# Patient Record
Sex: Male | Born: 1935 | Race: White | Hispanic: No | Smoking: Former smoker
Health system: Southern US, Community
[De-identification: ages and names within clinical notes are randomized; demographics above are authoritative.]

## PROBLEM LIST (undated history)

## (undated) DIAGNOSIS — I2510 Atherosclerotic heart disease of native coronary artery without angina pectoris: Secondary | ICD-10-CM

## (undated) DIAGNOSIS — R011 Cardiac murmur, unspecified: Secondary | ICD-10-CM

## (undated) DIAGNOSIS — IMO0001 Reserved for inherently not codable concepts without codable children: Secondary | ICD-10-CM

## (undated) DIAGNOSIS — K219 Gastro-esophageal reflux disease without esophagitis: Secondary | ICD-10-CM

## (undated) DIAGNOSIS — N183 Chronic kidney disease, stage 3 unspecified: Secondary | ICD-10-CM

## (undated) DIAGNOSIS — M21371 Foot drop, right foot: Secondary | ICD-10-CM

## (undated) DIAGNOSIS — G8929 Other chronic pain: Secondary | ICD-10-CM

## (undated) DIAGNOSIS — C801 Malignant (primary) neoplasm, unspecified: Secondary | ICD-10-CM

## (undated) DIAGNOSIS — M549 Dorsalgia, unspecified: Secondary | ICD-10-CM

## (undated) DIAGNOSIS — T4145XA Adverse effect of unspecified anesthetic, initial encounter: Secondary | ICD-10-CM

## (undated) DIAGNOSIS — M1990 Unspecified osteoarthritis, unspecified site: Secondary | ICD-10-CM

## (undated) DIAGNOSIS — N289 Disorder of kidney and ureter, unspecified: Secondary | ICD-10-CM

## (undated) DIAGNOSIS — I219 Acute myocardial infarction, unspecified: Secondary | ICD-10-CM

## (undated) DIAGNOSIS — T8859XA Other complications of anesthesia, initial encounter: Secondary | ICD-10-CM

## (undated) DIAGNOSIS — E7800 Pure hypercholesterolemia, unspecified: Secondary | ICD-10-CM

## (undated) DIAGNOSIS — G4700 Insomnia, unspecified: Secondary | ICD-10-CM

## (undated) DIAGNOSIS — I509 Heart failure, unspecified: Secondary | ICD-10-CM

## (undated) DIAGNOSIS — I209 Angina pectoris, unspecified: Secondary | ICD-10-CM

## (undated) DIAGNOSIS — I10 Essential (primary) hypertension: Secondary | ICD-10-CM

## (undated) HISTORY — PX: INGUINAL HERNIA REPAIR: SUR1180

## (undated) HISTORY — PX: EXCISIONAL HEMORRHOIDECTOMY: SHX1541

## (undated) HISTORY — PX: CHOLECYSTECTOMY: SHX55

## (undated) HISTORY — PX: ABDOMINAL AORTIC ANEURYSM REPAIR: SUR1152

## (undated) HISTORY — PX: JOINT REPLACEMENT: SHX530

## (undated) HISTORY — PX: ESOPHAGOGASTRODUODENOSCOPY: SHX1529

## (undated) HISTORY — PX: TONSILLECTOMY: SUR1361

## (undated) HISTORY — PX: TOTAL KNEE ARTHROPLASTY: SHX125

## (undated) HISTORY — PX: SKIN CANCER EXCISION: SHX779

---

## 1981-10-22 HISTORY — PX: CORONARY ARTERY BYPASS GRAFT: SHX141

## 1982-09-20 ENCOUNTER — Encounter: Payer: Self-pay | Admitting: Cardiology

## 1982-09-22 ENCOUNTER — Encounter: Payer: Self-pay | Admitting: Cardiology

## 2003-01-24 ENCOUNTER — Emergency Department (HOSPITAL_COMMUNITY): Admission: EM | Admit: 2003-01-24 | Discharge: 2003-01-24 | Payer: Self-pay | Admitting: Emergency Medicine

## 2004-02-29 ENCOUNTER — Encounter: Payer: Self-pay | Admitting: Cardiology

## 2009-03-16 ENCOUNTER — Encounter: Payer: Self-pay | Admitting: Cardiology

## 2009-07-01 ENCOUNTER — Encounter: Payer: Self-pay | Admitting: Cardiology

## 2009-12-12 ENCOUNTER — Ambulatory Visit: Payer: Self-pay | Admitting: Cardiology

## 2009-12-12 ENCOUNTER — Inpatient Hospital Stay (HOSPITAL_COMMUNITY): Admission: EM | Admit: 2009-12-12 | Discharge: 2009-12-20 | Payer: Self-pay | Admitting: Emergency Medicine

## 2009-12-13 ENCOUNTER — Encounter: Payer: Self-pay | Admitting: Cardiology

## 2009-12-16 ENCOUNTER — Encounter: Payer: Self-pay | Admitting: Cardiology

## 2009-12-22 ENCOUNTER — Ambulatory Visit: Payer: Self-pay | Admitting: Internal Medicine

## 2009-12-22 DIAGNOSIS — I5032 Chronic diastolic (congestive) heart failure: Secondary | ICD-10-CM

## 2009-12-22 DIAGNOSIS — I2510 Atherosclerotic heart disease of native coronary artery without angina pectoris: Secondary | ICD-10-CM | POA: Insufficient documentation

## 2009-12-22 DIAGNOSIS — I4891 Unspecified atrial fibrillation: Secondary | ICD-10-CM | POA: Insufficient documentation

## 2009-12-27 ENCOUNTER — Ambulatory Visit: Payer: Self-pay | Admitting: Cardiology

## 2009-12-28 ENCOUNTER — Telehealth (INDEPENDENT_AMBULATORY_CARE_PROVIDER_SITE_OTHER): Payer: Self-pay | Admitting: *Deleted

## 2010-01-05 ENCOUNTER — Ambulatory Visit: Payer: Self-pay | Admitting: Cardiology

## 2010-01-05 ENCOUNTER — Telehealth (INDEPENDENT_AMBULATORY_CARE_PROVIDER_SITE_OTHER): Payer: Self-pay | Admitting: *Deleted

## 2010-01-05 DIAGNOSIS — R079 Chest pain, unspecified: Secondary | ICD-10-CM | POA: Insufficient documentation

## 2010-01-05 DIAGNOSIS — I25810 Atherosclerosis of coronary artery bypass graft(s) without angina pectoris: Secondary | ICD-10-CM | POA: Insufficient documentation

## 2010-01-05 DIAGNOSIS — I739 Peripheral vascular disease, unspecified: Secondary | ICD-10-CM | POA: Insufficient documentation

## 2010-01-09 LAB — CONVERTED CEMR LAB
BUN: 25 mg/dL — ABNORMAL HIGH (ref 6–23)
Calcium: 9 mg/dL (ref 8.4–10.5)
Chloride: 104 meq/L (ref 96–112)
Creatinine, Ser: 1.5 mg/dL (ref 0.4–1.5)
GFR calc non Af Amer: 47.6 mL/min (ref >60)
Glucose, Bld: 88 mg/dL (ref 70–99)
Potassium: 4.5 meq/L (ref 3.5–5.1)
Pro B Natriuretic peptide (BNP): 178 pg/mL — ABNORMAL HIGH (ref 0.0–100.0)

## 2010-01-12 ENCOUNTER — Telehealth (INDEPENDENT_AMBULATORY_CARE_PROVIDER_SITE_OTHER): Payer: Self-pay | Admitting: *Deleted

## 2010-01-16 ENCOUNTER — Ambulatory Visit: Payer: Self-pay

## 2010-01-16 ENCOUNTER — Encounter: Payer: Self-pay | Admitting: Cardiology

## 2010-01-16 ENCOUNTER — Encounter (INDEPENDENT_AMBULATORY_CARE_PROVIDER_SITE_OTHER): Payer: Self-pay | Admitting: *Deleted

## 2010-01-16 ENCOUNTER — Encounter (HOSPITAL_COMMUNITY): Admission: RE | Admit: 2010-01-16 | Discharge: 2010-03-22 | Payer: Self-pay | Admitting: Cardiology

## 2010-01-16 ENCOUNTER — Ambulatory Visit: Payer: Self-pay | Admitting: Internal Medicine

## 2010-01-16 LAB — CONVERTED CEMR LAB: POC INR: 2.3

## 2010-01-31 ENCOUNTER — Ambulatory Visit: Payer: Self-pay | Admitting: Cardiology

## 2010-01-31 LAB — CONVERTED CEMR LAB: POC INR: 2

## 2010-02-06 ENCOUNTER — Ambulatory Visit: Payer: Self-pay | Admitting: Cardiovascular Disease

## 2010-02-06 ENCOUNTER — Inpatient Hospital Stay (HOSPITAL_COMMUNITY): Admission: EM | Admit: 2010-02-06 | Discharge: 2010-02-11 | Payer: Self-pay | Admitting: Emergency Medicine

## 2010-02-06 ENCOUNTER — Ambulatory Visit: Payer: Self-pay | Admitting: Infectious Diseases

## 2010-02-09 ENCOUNTER — Encounter: Payer: Self-pay | Admitting: Infectious Diseases

## 2010-02-28 ENCOUNTER — Ambulatory Visit: Payer: Self-pay | Admitting: Cardiology

## 2010-03-15 ENCOUNTER — Ambulatory Visit: Payer: Self-pay | Admitting: Internal Medicine

## 2010-05-09 ENCOUNTER — Encounter (INDEPENDENT_AMBULATORY_CARE_PROVIDER_SITE_OTHER): Payer: Self-pay | Admitting: Pharmacist

## 2010-05-31 ENCOUNTER — Encounter: Payer: Self-pay | Admitting: Cardiology

## 2010-11-21 NOTE — Progress Notes (Signed)
Summary: Able to leave message  Phone Note Outgoing Call   Call placed by: Milana Na, EMT-P,  January 12, 2010 3:12 PM Summary of Call: Left message with information on Myoview Information Sheet (see scanned document for details). Tried the patient several times and was finally able to leave a message on the answering machine.

## 2010-11-21 NOTE — Op Note (Signed)
Summary: [facility]  [facility]   Imported By: Marylou Mccoy 01/05/2010 11:19:51  _____________________________________________________________________  External Attachment:    Type:   Image     Comment:   External Document

## 2010-11-21 NOTE — Medication Information (Signed)
Summary: ccn/ gd  Anticoagulant Therapy  Managed by: Leota Sauers, PharmD, BCPS, CPP Referring MD: Dr. Orpah Clinton MD: Tenny Craw MD, Gunnar Fusi Indication 1: Atrial Fibrillation [place] Site: [address] INR POC 3.6 INR RANGE 2-3  Dietary changes: no    Health status changes: no    Bleeding/hemorrhagic complications: no    Recent/future hospitalizations: no    Any changes in medication regimen? no    Recent/future dental: no  Any missed doses?: no       Is patient compliant with meds? yes      Comments: new to coumadin since hospitalization for HF exaserbation and ?new onset Afib.  D/c hosptilal 3/1 was taking 5, 5, 7.5x3 INR on 3/1 1.7 at d/c.  Then 10mg  3/1 and 3/2  Current Medications (verified): 1)  Amlodipine Besylate 10 Mg Tabs (Amlodipine Besylate) .... Take One Tablet By Mouth Daily 2)  Warfarin Sodium 5 Mg Tabs (Warfarin Sodium) .... Use As Directed By Anticoagulation Clinic 3)  Hydralazine Hcl 25 Mg Tabs (Hydralazine Hcl) .... Take One Tablet By Mouth Three Times A Day 4)  Furosemide 40 Mg Tabs (Furosemide) .... Take One and 1/2 Tablet By Mouth Daily. 5)  Lisinopril 20 Mg Tabs (Lisinopril) .... Take One Tablet By Mouth Two Times A Day 6)  Simvastatin 20 Mg Tabs (Simvastatin) .... Take One Tablet By Mouth Daily At Bedtime 7)  Aspirin 81 Mg Tbec (Aspirin) .... Take One Tablet By Mouth Daily 8)  Nitrostat 0.4 Mg Subl (Nitroglycerin) .Marland Kitchen.. 1 Tablet Under Tongue At Onset of Chest Pain; You May Repeat Every 5 Minutes For Up To 3 Doses.  Allergies (verified): No Known Drug Allergies  Anticoagulation Management History:      The patient comes in today for his initial visit for anticoagulation therapy.  Positive risk factors for bleeding include an age of 75 years or older.  The bleeding index is 'intermediate risk'.  Positive CHADS2 values include History of CHF and Age > 46 years old.  Anticoagulation responsible provider: Tenny Craw MD, Gunnar Fusi.  INR POC: 3.6.  Cuvette Lot#:  [id number].  Exp: 02/2011.    Anticoagulation Management Assessment/Plan:      The patient's current anticoagulation dose is Warfarin sodium 5 mg tabs: Use as directed by Anticoagulation Clinic.  The target INR is 2.0-3.0.  The next INR is due 12/27/2009.  Results were reviewed/authorized by Leota Sauers, PharmD, BCPS, CPP.         Current Anticoagulation Instructions: INR 3.6  more than goal 2-3  Eat green vegetables 2-3 days a week.    No more shots No Couamdin today Thur 3/3 Coumadin 1 tab = 5mg  each day start Fri 3/4

## 2010-11-21 NOTE — Letter (Signed)
Summary: [facility] Progress Note  [facility] Progress Note   Imported By: Roderic Ovens 01/30/2010 13:43:45  _____________________________________________________________________  External Attachment:    Type:   Image     Comment:   External Document

## 2010-11-21 NOTE — Cardiovascular Report (Signed)
Summary: [facility]  Cardiac Cath [facility]   Imported By: Marylou Mccoy 01/05/2010 11:21:23  _____________________________________________________________________  External Attachment:    Type:   Image     Comment:   External Document

## 2010-11-21 NOTE — Progress Notes (Signed)
Summary: Nuclear Pre-Procedure  Phone Note Outgoing Call   Call placed by: Milana Na, EMT-P,  January 12, 2010 2:42 PM Summary of Call: Reviewed information on Myoview Information Sheet (see scanned document for further details).  No Answer     Nuclear Med Background Indications for Stress Test: Evaluation for Ischemia, Graft Patency, Post Hospital  Indications Comments: 02/11 Diastolic CHF exacurbation, New Afib  History: CABG, Cardioversion, Echo, Myocardial Perfusion Study  History Comments: '83 CABG x 5 '[facility] 02/11 Cardioversion PAF 02/11 ECHO EF 55-60% mild AI and MR  Symptoms: Chest Pain    Nuclear Pre-Procedure Cardiac Risk Factors: Family History - CAD, History of Smoking, Hypertension, Lipids, Obesity Height (in): 73  Nuclear Med Study Referring MD:  Shirlee Latch MD, Freida Busman

## 2010-11-21 NOTE — Letter (Signed)
Summary: [facility] Office Note  [facility] Office Note   Imported By: Roderic Ovens 01/30/2010 13:40:54  _____________________________________________________________________  External Attachment:    Type:   Image     Comment:   External Document

## 2010-11-21 NOTE — Progress Notes (Signed)
Summary: refill meds  Phone Note From Pharmacy Call back at [facility] Phone [phone]   Caller: Gwenevere Abbot office [phone] / ref # [phone].  Request: Speak with Nurse Details for Reason: WARFARIN SODIUM 5 MG TABS Use as directed by Anticoagulation Clinic Initial call taken by: Lorne Skeens,  December 28, 2009 12:44 PM  Follow-up for Phone Call        Spoke with [employer] Rep. they had called about genetic testing. She states they had made 3 contacts with office and case was closed.  Follow-up by: Bethena Midget, RN, BSN,  December 29, 2009 10:22 AM

## 2010-11-21 NOTE — Assessment & Plan Note (Signed)
Summary: Cardiology Nuclear Study  Nuclear Med Background Indications for Stress Test: Evaluation for Ischemia, Graft Patency, Post Hospital  Indications Comments: 02/11 Diastolic CHF exacurbation, New Afib  History: CABG, Cardioversion, Echo, Heart Catheterization, Myocardial Perfusion Study  History Comments: '83 CABG x 5; '06 MPS[facility]normal; 10/10 iliac aneurysm repair and fem-fem bypass; 2/11 Cardioversion for PAF; 2/11 Echo:EF= 55-60%,  mild AI and MR; s/p AAA repair; h/io chronic diastolic CHF  Symptoms: DOE  Symptoms Comments: c/o (B) shoulder pain with walking; last episode 2 weeks ago.   Nuclear Pre-Procedure Cardiac Risk Factors: Family History - CAD, History of Smoking, Hypertension, Lipids, Obesity, PVD Caffeine/Decaff Intake: None NPO After: 11:00 PM Lungs: Clear.  O2 Sat 98% on RA. IV 0.9% NS with Angio Cath: 22g     IV Site: (R) AC IV Started by: Irean Hong RN Chest Size (in) 50     Height (in): 73 Weight (lb): 220 BMI: 29.13 Tech Comments: The patient was given Imdur on 01/05/10, but the patient stopped it due to blurred vision, and lightheadedness. Dr. Alford Highland nurse notified with orders to discontinue Imdur per Dr. Shirlee Latch. Patsy Edwards,RN.  Nuclear Med Study 1 or 2 day study:  1 day     Stress Test Type:  Eugenie Birks Reading MD:  Dietrich Pates, MD     Referring MD:  Marca Ancona, MD Resting Radionuclide:  Technetium 11m Tetrofosmin     Resting Radionuclide Dose:  11 mCi  Stress Radionuclide:  Technetium 80m Tetrofosmin     Stress Radionuclide Dose:  33 mCi   Stress Protocol   Lexiscan: 0.4 mg   Stress Test Technologist:  Rea [employer] CMA-N     Nuclear Technologist:  Domenic Polite CNMT  Rest Procedure  Myocardial perfusion imaging was performed at rest 45 minutes following the intravenous administration of Myoview Technetium 83m Tetrofosmin.  Stress Procedure  The patient received IV Lexiscan 0.4 mg over 15-seconds.  Myoview injected at 30-seconds.   There were no significant changes with lexiscan.  He did c/o chest tightness.  Quantitative spect images were obtained after a 45 minute delay.  QPS Raw Data Images:  Soft tissue (diaphragm, subcutaneous fat) surround heart. Stress Images:  Mild thinning in the inferior base; otherwise normal perfusion. Rest Images:  No significant change from the stress images Subtraction (SDS):  No evidence of ischemia. Transient Ischemic Dilatation:  1.21  (Normal <1.22)  Lung/Heart Ratio:  .26  (Normal <0.45)  Quantitative Gated Spect Images QGS EDV:  180 ml QGS ESV:  72 ml QGS EF:  60 %   Overall Impression  Exercise Capacity: Lexiscan protocol BP Response: Normal blood pressure response. Clinical Symptoms: Chest tightness ECG Impression: No significant ST segment change suggestive of ischemia. Overall Impression: Normal stress nuclear study.  Appended Document: Cardiology Nuclear Study normal study  Appended Document: Cardiology Nuclear Study discussed results with patient by telephone

## 2010-11-21 NOTE — Medication Information (Signed)
Summary: rov/tm  Anticoagulant Therapy  Managed by: Eda Keys, PharmD Referring MD: Marca Ancona, MD PCP: [facility] Supervising MD: Gala Romney MD, Reuel Boom Indication 1: Atrial Fibrillation Lab Used: [facility] [place] Site: [address] INR POC 2.0 INR RANGE 2-3  Dietary changes: no    Health status changes: no    Bleeding/hemorrhagic complications: no    Recent/future hospitalizations: no    Any changes in medication regimen? no    Recent/future dental: no  Any missed doses?: no       Is patient compliant with meds? yes       Allergies: 1)  ! * Ether 2)  Imdur  Anticoagulation Management History:      The patient is taking warfarin and comes in today for a routine follow up visit.  Positive risk factors for bleeding include an age of 46 years or older.  The bleeding index is 'intermediate risk'.  Positive CHADS2 values include History of CHF and Age > 73 years old.  Anticoagulation responsible provider: Quamel Fitzmaurice MD, Reuel Boom.  INR POC: 2.0.  Cuvette Lot#: [id number].  Exp: 05/2011.    Anticoagulation Management Assessment/Plan:      The patient's current anticoagulation dose is Warfarin sodium 5 mg tabs: Use as directed by Anticoagulation Clinic.  The target INR is 2.0-3.0.  The next INR is due 04/04/2010.  Anticoagulation instructions were given to patient.  Results were reviewed/authorized by Eda Keys, PharmD.  He was notified by Eda Keys.         Prior Anticoagulation Instructions: INR 1.7 Today take 2 pills then change dose to 1 pill everyday except 1.5 pills on Tuesdays, Thursdays and Saturdays. Recheck in 2 weeks.   Current Anticoagulation Instructions: INR 2.0  Take 1.5 tablets today.  Then return to normal dosing schedule of 1.5 tablets on Tuesday, Thursday, and Saturday and 1 tablet all other days.  Return to clinic in 3 weeks.

## 2010-11-21 NOTE — Assessment & Plan Note (Signed)
Summary: eph/ gd   Visit Type:  Follow-up Primary Provider:  [facility]   History of Present Illness: 77 yo with history of CAD, AAA repair, bradycardia, and PAD presents for followup of recent hospitalization.  Patient was admitted in 2/11 with diastolic CHF exacerbation.  He was noted to have new atrial fibrillation.  Patient was diuresed and underwent TEE-guided cardioversion to NSR.  His beta blocker was stopped.  He was noted to have asymptomatic bradycardia into the 30s at times.    Since discharge, he has done well.  He is back at work 3 days a week and is bowling.  He does report pain in his shoulders bilaterally with walking.  This occurs 2-3 times a week with brisk walking and resolves with rest.  No chest pain.  No orthopnea or PND.  No significant exertioanl noBP has been running 120s/70s at home. Patient reports pain in his hips, keses.    ECG: NSR, blocked PAC with escape beat.    Labs (3/11): K 4.3, creatinine 1.36, TSH normal, LDL 75, HDL 28  ECG: NSR at 55, blocked PACs   Current Medications (verified): 1)  Amlodipine Besylate 10 Mg Tabs (Amlodipine Besylate) .... Take One Tablet By Mouth Daily 2)  Warfarin Sodium 5 Mg Tabs (Warfarin Sodium) .... Use As Directed By Anticoagulation Clinic 3)  Hydralazine Hcl 25 Mg Tabs (Hydralazine Hcl) .... Take One Tablet By Mouth Three Times A Day 4)  Furosemide 40 Mg Tabs (Furosemide) .... Take One and 1/2 Tablet By Mouth Daily. 5)  Lisinopril 20 Mg Tabs (Lisinopril) .... Take One Tablet By Mouth Two Times A Day 6)  Simvastatin 20 Mg Tabs (Simvastatin) .... Take One Tablet By Mouth Daily At Bedtime 7)  Aspirin 81 Mg Tbec (Aspirin) .... Take One Tablet By Mouth Daily  Allergies (verified): No Known Drug Allergies  Past History:  Past Medical History: 1. CAD: Status post aortocoronary bypass surgery in 1983 x5 with no cath since that time.  History of a nuclear stress test by the [place] in approximately 2006, negative per paient's  report.  2. Hypertension 3. Hyperlipidemia 4. Obesity 5. History of bradycardia with a heart rate in the 40s for years.  No nodal blockers.  6. Severe osteoarthritis with chronic low back pain 7. Diastolic CHF: Echo (2/11) with EF 55-60%, moderate LVH, no regional WMAs, moderate diastolic dysfunction, mild AI, mild MR.   8. AAA repair 9. Iliac aneurysm repair in 10/10 at [facility].  Patient also describes a fem-fem bypass done at the same time. 10. CKD: Atrophic left kidney.  11. Bilateral TKRs 12. Paroxysmal atrial fibrillation: cardioverted to NSR in 2/11  Family History: Reviewed history and no changes required. Parents died in their 43s, of "heart disease."   Social History: Reviewed history from 01/02/2010 and no changes required.  Lives in [place] with his wife.  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  He has an approximately 10-pack-year history of tobacco use and quit in 1968.  He denies alcohol or drug abuse.   Review of Systems       All systems reviewed and negative except as per HPI.   Vital Signs:  Patient profile:   77 year old male Height:      73 inches Weight:      223 pounds BMI:     29.53 Pulse rate:   56 / minute Pulse rhythm:   irregular BP sitting:   140 / 56  (left  arm)  Vitals Entered By: Katina Dung, RN, BSN (January 05, 2010 8:14 AM)  Physical Exam  General:  Well developed, well nourished, in no acute distress. Head:  normocephalic and atraumatic Nose:  no deformity, discharge, inflammation, or lesions Mouth:  Teeth, gums and palate normal. Oral mucosa normal. Neck:  Neck supple, no JVD. No masses, thyromegaly or abnormal cervical nodes. Lungs:  Clear bilaterally to auscultation and percussion. Heart:  Non-displaced PMI, chest non-tender; regular rate and rhythm, S1, S2 without murmurs, rubs or gallops. Carotid upstroke normal, no bruit. Weak pedal pulses. No edema, no varicosities. Abdomen:  Bowel sounds positive; abdomen soft and  non-tender without masses, organomegaly, or hernias noted. No hepatosplenomegaly. Msk:  Back normal, normal gait. Muscle strength and tone normal. Extremities:  No clubbing or cyanosis. Neurologic:  Alert and oriented x 3. Psych:  Normal affect.   Impression & Recommendations:  Problem # 1:  ATRIAL FIBRILLATION (ICD-427.31) Patient is well-rate controlled. Will continue warfarin.  Avoiding Pradaxa given significant CKD>   Problem # 2:  CHRONIC DIASTOLIC HEART FAILURE (ICD-428.32) Patient is doing well, appears euvolemic.  Continue current dose of Lasix at 60 mg daily.   Problem # 3:  CORONARY ARTERY DISEASE (ICD-414.00) Patient does get bilateral shoulder pain with exertion. This could be an anginal equivalent.  I will have him do a Lexiscan myoview to risk stratify.  I will start him on Imdur 30 mg daily to raise anginal threshold.  We will try to pull up his surgical operative note from 1983. .                                                                                                                                                                                                                                                                                                                                            Problem # 4:  HIP  AND KNEE , LEG WEAKNESS Given weak pedal pulses, will check arterial doppler to make sure that there is no abnormality.     Other Orders: EKG w/ Interpretation (93000) Arterial Duplex Lower Extremity (Arterial Duplex Low) Nuclear Stress Test (Nuc Stress Test) TLB-BNP (B-Natriuretic Peptide) (83880-BNPR) TLB-BMP (Basic Metabolic Panel-BMET) (80048-METABOL)  Patient Instructions: 1)  Your physician has recommended you make the following change in your medication:  2)  Start Imdur 30mg  daily 3)  Your physician recommends that you have  lab work today--BMP/BNP 4)  Your physician has requested that you have a lower or upper extremity arterial duplex.   This test is an ultrasound of the arteries in the legs or arms.  It looks at arterial blood flow in the legs and arms.  Allow one hour for Lower and Upper Arterial scans. There are no restrictions or special instructions. 5)  Your physician has requested that you have an lexiscan myoview.  For further information please visit [email or web address]  Please follow instruction sheet, as given. 6)  Your physician recommends that you schedule a follow-up appointment in: 1 month with Dr Shirlee Latch Prescriptions: ISOSORBIDE MONONITRATE CR 30 MG XR24H-TAB (ISOSORBIDE MONONITRATE) one tablet daily  #30 x 6   Entered by:   Katina Dung, RN, BSN   Authorized by:   Marca Ancona, MD   Signed by:   Katina Dung, RN, BSN on 01/05/2010   Method used:   Electronically to        [facility]  [id number] ([facility])       [address]       [place], [place]  [address]       Ph: [phone] or [phone]       Fax: [phone]   RxID:   [id number]     Appended Document: eph/ gd talked with pt by telephone--pt states he has not taken Imdur since Sunday because of weakness in his legs and lighteheadedness--he did not take Imdur today and he states he feels fine --I will forward to Dr Shirlee Latch for review  Appended Document: eph/ gd fine to stay off imdur  Appended Document: eph/ gd LMVM to call me  Appended Document: eph/ gd pt in office for a myoview-was told today that he could remain off Imdur

## 2010-11-21 NOTE — Medication Information (Signed)
Summary: rov/tm  Anticoagulant Therapy  Managed by: Leota Sauers, PharmD, BCPS, CPP Referring MD: Shirlee Latch MD, Thornton Papas MD: Shirlee Latch MD, [place] Indication 1: Atrial Fibrillation Lab Used: [facility] [place] Site: [address] INR POC 1.8 INR RANGE 2-3  Dietary changes: yes       Details: incr greens this week  Health status changes: no    Bleeding/hemorrhagic complications: no    Recent/future hospitalizations: no    Any changes in medication regimen? no    Recent/future dental: no  Any missed doses?: no       Is patient compliant with meds? yes       Current Medications (verified): 1)  Amlodipine Besylate 10 Mg Tabs (Amlodipine Besylate) .... Take One Tablet By Mouth Daily 2)  Warfarin Sodium 5 Mg Tabs (Warfarin Sodium) .... Use As Directed By Anticoagulation Clinic 3)  Hydralazine Hcl 25 Mg Tabs (Hydralazine Hcl) .... Take One Tablet By Mouth Three Times A Day 4)  Furosemide 40 Mg Tabs (Furosemide) .... Take One and 1/2 Tablet By Mouth Daily. 5)  Lisinopril 20 Mg Tabs (Lisinopril) .... Take One Tablet By Mouth Two Times A Day 6)  Simvastatin 20 Mg Tabs (Simvastatin) .... Take One Tablet By Mouth Daily At Bedtime 7)  Aspirin 81 Mg Tbec (Aspirin) .... Take One Tablet By Mouth Daily 8)  Nitrostat 0.4 Mg Subl (Nitroglycerin) .Marland Kitchen.. 1 Tablet Under Tongue At Onset of Chest Pain; You May Repeat Every 5 Minutes For Up To 3 Doses.  Allergies (verified): No Known Drug Allergies  Anticoagulation Management History:      The patient is taking warfarin and comes in today for a routine follow up visit.  Positive risk factors for bleeding include an age of 29 years or older.  The bleeding index is 'intermediate risk'.  Positive CHADS2 values include History of CHF and Age > 16 years old.  Anticoagulation responsible provider: Shirlee Latch MD, [place].  INR POC: 1.8.  Cuvette Lot#: [id number].  Exp: 02/2011.    Anticoagulation Management Assessment/Plan:      The  patient's current anticoagulation dose is Warfarin sodium 5 mg tabs: Use as directed by Anticoagulation Clinic.  The target INR is 2.0-3.0.  The next INR is due 01/19/2010.  Anticoagulation instructions were given to patient.  Results were reviewed/authorized by Leota Sauers, PharmD, BCPS, CPP.         Prior Anticoagulation Instructions: INR 2.1 Change dose to 1 tablet everyday except 1.5 tablets on Tuesdays and Saturdays. Recheck in one week.   Current Anticoagulation Instructions: INR 1.8  Coumadin 1 and 1/2 tab today THUR 3/17 THEN 1 TAB EACH DAY EXCEPT TUE AND SAT = 1 AND 1/2

## 2010-11-21 NOTE — Letter (Signed)
Summary: [facility]  Coumadin  [address]   [place], [place] [address]   Phone: [phone]  Fax: [phone]     May 09, 2010 MRN: [id number]   Gregg Hodges [address] [place], [place]  [address]   Dear Gregg Hodges,  This letter is being sent to you as a reminder that it is necessary for you to get your INR/PT checked regularly so that we can optimize your care.  Our records indicate that you were scheduled to have a test done recently.  As of today, we have not received the results of this test.  It is very important that you have your INR checked.  Please call our office at the number listed above to schedule an appointment at your earliest convenience.    If you have recently had your protime checked or have discontinued this medication, please contact our office at the above phone number to clarify this issue.  Thank you for this prompt attention to this important health care matter.  Sincerely,   [facility] Reduction [facility] Team    Appended Document: [facility]ent Coumadin 1 Attempted to call pt no answer at home #, no alt numbers available.

## 2010-11-21 NOTE — Medication Information (Signed)
Summary: Coumadin Clinic  Anticoagulant Therapy  Managed by: Inactive Referring MD: Marca Ancona, MD PCP: [facility] Supervising MD: Jens Som MD, Arlys John Indication 1: Atrial Fibrillation Lab Used: [facility] [place] Site: [address] INR RANGE 2-3          Comments: Pt states that he is now followed at [place] in [place]  Allergies: 1)  ! * Ether 2)  Imdur  Anticoagulation Management History:      Positive risk factors for bleeding include an age of 75 years or older.  The bleeding index is 'intermediate risk'.  Positive CHADS2 values include History of CHF and Age > 6 years old.  Anticoagulation responsible provider: Jens Som MD, Arlys John.  Exp: 05/2011.    Anticoagulation Management Assessment/Plan:      The patient's current anticoagulation dose is Warfarin sodium 5 mg tabs: Use as directed by Anticoagulation Clinic.  The target INR is 2.0-3.0.  The next INR is due 04/04/2010.  Anticoagulation instructions were given to patient.  Results were reviewed/authorized by Inactive.         Prior Anticoagulation Instructions: INR 2.0  Take 1.5 tablets today.  Then return to normal dosing schedule of 1.5 tablets on Tuesday, Thursday, and Saturday and 1 tablet all other days.  Return to clinic in 3 weeks.

## 2010-11-21 NOTE — Letter (Signed)
Summary: [facility]  [facility] [id number]   Imported By: Roderic Ovens 01/30/2010 13:42:16  _____________________________________________________________________  External Attachment:    Type:   Image     Comment:   External Document

## 2010-11-21 NOTE — Medication Information (Signed)
Summary: rov/cb  Anticoagulant Therapy  Managed by: Bethena Midget, RN, BSN Referring MD: Marca Ancona, MD PCP: [facility] Supervising MD: Jens Som MD, Arlys John Indication 1: Atrial Fibrillation Lab Used: [facility] [place] Site: [address] INR POC 1.7 INR RANGE 2-3  Dietary changes: no    Health status changes: no    Bleeding/hemorrhagic complications: no    Recent/future hospitalizations: no    Any changes in medication regimen? no    Recent/future dental: no  Any missed doses?: no       Is patient compliant with meds? yes      Comments: 02/09/10 pt had Gallbladder removed, [place] attack [place] on 02[place]05[place]10   Allergies: 1)  ! * Ether 2)  Imdur  Anticoagulation Management History:      The patient is taking warfarin and comes in today for a routine follow up visit.  Positive risk factors for bleeding include an age of 75 years or older.  The bleeding index is 'intermediate risk'.  Positive CHADS2 values include History of CHF and Age > 51 years old.  Anticoagulation responsible provider: Jens Som MD, Arlys John.  INR POC: 1.7.  Cuvette Lot#: [id number].  Exp: 05/2011.    Anticoagulation Management Assessment/Plan:      The patient's current anticoagulation dose is Warfarin sodium 5 mg tabs: Use as directed by Anticoagulation Clinic.  The target INR is 2.0-3.0.  The next INR is due 03/15/2010.  Anticoagulation instructions were given to patient.  Results were reviewed/authorized by Bethena Midget, RN, BSN.  He was notified by Bethena Midget, RN, BSN.         Prior Anticoagulation Instructions: INR 2.0. Take 1 tablet daily except 1.5 tablets on Tues and Sat.  Recheck in 4 weeks.  Current Anticoagulation Instructions: INR 1.7 Today take 2 pills then change dose to 1 pill everyday except 1.5 pills on Tuesdays, Thursdays and Saturdays. Recheck in 2 weeks.

## 2010-11-21 NOTE — Miscellaneous (Signed)
Summary: Outpatient Coinsurance Notice  Outpatient Coinsurance Notice   Imported By: Marylou Mccoy 01/25/2010 10:24:42  _____________________________________________________________________  External Attachment:    Type:   Image     Comment:   External Document

## 2010-11-21 NOTE — Medication Information (Signed)
Summary: rov/ewj  Anticoagulant Therapy  Managed by: Elaina Pattee, PharmD Referring MD: Marca Ancona, MD PCP: [facility] Supervising MD: Tenny Craw MD, Gunnar Fusi Indication 1: Atrial Fibrillation Lab Used: [facility] [place] Site: [address] INR POC 2.0 INR RANGE 2-3  Dietary changes: no    Health status changes: yes       Details: Stress test ok.  Bleeding/hemorrhagic complications: no    Recent/future hospitalizations: no    Any changes in medication regimen? no    Recent/future dental: no  Any missed doses?: no       Is patient compliant with meds? yes       Allergies: 1)  ! * Ether 2)  Imdur  Anticoagulation Management History:      The patient is taking warfarin and comes in today for a routine follow up visit.  Positive risk factors for bleeding include an age of 75 years or older.  The bleeding index is 'intermediate risk'.  Positive CHADS2 values include History of CHF and Age > 75 years old.  Anticoagulation responsible provider: Tenny Craw MD, Gunnar Fusi.  INR POC: 2.0.  Cuvette Lot#: [id number].  Exp: 02/2011.    Anticoagulation Management Assessment/Plan:      The patient's current anticoagulation dose is Warfarin sodium 5 mg tabs: Use as directed by Anticoagulation Clinic.  The target INR is 2.0-3.0.  The next INR is due 02/28/2010.  Anticoagulation instructions were given to patient.  Results were reviewed/authorized by Elaina Pattee, PharmD.  He was notified by Elaina Pattee, PharmD.         Prior Anticoagulation Instructions: INR 2.3  Continue on same dosage 1 tablet daily except 1.5 tablets on Tuesdays and Saturdays.  Recheck in 2 weeks.    Current Anticoagulation Instructions: INR 2.0. Take 1 tablet daily except 1.5 tablets on Tues and Sat.  Recheck in 4 weeks.

## 2010-11-21 NOTE — Progress Notes (Signed)
  Faxed ROI over to [facility] to fax [phone],Pt signed while in office this AM [facility]  January 05, 2010 1:29 PM    Appended Document:  Recieved Records today,forwarded to [facility] for 02/14/10 appt w/ Shirlee Latch @8 :45 am   Appended Document:  I am keeping the Records for [name] ...Marland KitchenMarland KitchenThey will b in my Drawer w/ the other ROI forms [name] will not have them...KM

## 2010-11-21 NOTE — Medication Information (Signed)
Summary: [facility]  Anticoagulant Therapy  Managed by: Cloyde Reams, RN, BSN Referring MD: Shirlee Latch MD, Dalton PCP: [facility] Supervising MD: Tenny Craw MD, Gunnar Fusi Indication 1: Atrial Fibrillation Lab Used: [facility] [place] Site: [address] INR POC 2.3 INR RANGE 2-3  Dietary changes: no    Health status changes: no    Bleeding/hemorrhagic complications: no    Recent/future hospitalizations: no    Any changes in medication regimen? no    Recent/future dental: no  Any missed doses?: no       Is patient compliant with meds? yes       Allergies (verified): No Known Drug Allergies  Anticoagulation Management History:      The patient is taking warfarin and comes in today for a routine follow up visit.  Positive risk factors for bleeding include an age of 75 years or older.  The bleeding index is 'intermediate risk'.  Positive CHADS2 values include History of CHF and Age > 47 years old.  Anticoagulation responsible provider: Tenny Craw MD, Gunnar Fusi.  INR POC: 2.3.  Cuvette Lot#: [id number].  Exp: 02/2011.    Anticoagulation Management Assessment/Plan:      The patient's current anticoagulation dose is Warfarin sodium 5 mg tabs: Use as directed by Anticoagulation Clinic.  The target INR is 2.0-3.0.  The next INR is due 01/31/2010.  Anticoagulation instructions were given to patient.  Results were reviewed/authorized by Cloyde Reams, RN, BSN.  He was notified by Cloyde Reams RN.         Prior Anticoagulation Instructions: INR 1.8  Hodges 1 and 1/2 tab today THUR 3/17 THEN 1 TAB EACH DAY EXCEPT TUE AND SAT = 1 AND 1/2  Current Anticoagulation Instructions: INR 2.3  Continue on same dosage 1 tablet daily except 1.5 tablets on Tuesdays and Saturdays.  Recheck in 2 weeks.

## 2010-11-21 NOTE — Medication Information (Signed)
Summary: rov coumadin - [facility]  Anticoagulant Therapy  Managed by: Bethena Midget, RN, BSN Referring MD: Shirlee Latch MD, Thornton Papas MD: Myrtis Ser MD, Tinnie Gens Indication 1: Atrial Fibrillation Lab Used: [facility] [place] Site: [address] INR POC 2.1 INR RANGE 2-3  Dietary changes: no    Health status changes: no    Bleeding/hemorrhagic complications: no     Any changes in medication regimen? no     Any missed doses?: no       Is patient compliant with meds? yes       Allergies: No Known Drug Allergies  Anticoagulation Management History:      The patient is taking warfarin and comes in today for a routine follow up visit.  Positive risk factors for bleeding include an age of 75 years or older.  The bleeding index is 'intermediate risk'.  Positive CHADS2 values include History of CHF and Age > 74 years old.  Anticoagulation responsible provider: Myrtis Ser MD, Tinnie Gens.  INR POC: 2.1.  Cuvette Lot#: [id number].  Exp: 02/2011.    Anticoagulation Management Assessment/Plan:      The patient's current anticoagulation dose is Warfarin sodium 5 mg tabs: Use as directed by Anticoagulation Clinic.  The target INR is 2.0-3.0.  The next INR is due 01/05/2010.  Anticoagulation instructions were given to patient.  Results were reviewed/authorized by Bethena Midget, RN, BSN.  He was notified by Bethena Midget, RN, BSN.         Prior Anticoagulation Instructions: INR 3.6  more than goal 2-3  Eat green vegetables 2-3 days a week.    No more shots No Couamdin today Thur 3/3 Coumadin 1 tab = 5mg  each day start Fri 3/4  Current Anticoagulation Instructions: INR 2.1 Change dose to 1 tablet everyday except 1.5 tablets on Tuesdays and Saturdays. Recheck in one week.

## 2011-01-09 LAB — HEPATIC FUNCTION PANEL
ALT: 80 U/L — ABNORMAL HIGH (ref 0–53)
AST: 70 U/L — ABNORMAL HIGH (ref 0–37)
Alkaline Phosphatase: 128 U/L — ABNORMAL HIGH (ref 39–117)
Bilirubin, Direct: 0.6 mg/dL — ABNORMAL HIGH (ref 0.0–0.3)
Total Bilirubin: 1.5 mg/dL — ABNORMAL HIGH (ref 0.3–1.2)

## 2011-01-09 LAB — COMPREHENSIVE METABOLIC PANEL
ALT: 162 U/L — ABNORMAL HIGH (ref 0–53)
ALT: 68 U/L — ABNORMAL HIGH (ref 0–53)
AST: 168 U/L — ABNORMAL HIGH (ref 0–37)
AST: 182 U/L — ABNORMAL HIGH (ref 0–37)
AST: 39 U/L — ABNORMAL HIGH (ref 0–37)
Albumin: 2.7 g/dL — ABNORMAL LOW (ref 3.5–5.2)
Albumin: 2.9 g/dL — ABNORMAL LOW (ref 3.5–5.2)
Alkaline Phosphatase: 114 U/L (ref 39–117)
Alkaline Phosphatase: 93 U/L (ref 39–117)
BUN: 18 mg/dL (ref 6–23)
BUN: 25 mg/dL — ABNORMAL HIGH (ref 6–23)
BUN: 9 mg/dL (ref 6–23)
CO2: 24 mEq/L (ref 19–32)
CO2: 28 mEq/L (ref 19–32)
CO2: 28 mEq/L (ref 19–32)
Calcium: 8.2 mg/dL — ABNORMAL LOW (ref 8.4–10.5)
Calcium: 8.3 mg/dL — ABNORMAL LOW (ref 8.4–10.5)
Calcium: 8.4 mg/dL (ref 8.4–10.5)
Chloride: 107 mEq/L (ref 96–112)
Chloride: 107 mEq/L (ref 96–112)
Chloride: 109 mEq/L (ref 96–112)
Creatinine, Ser: 1.07 mg/dL (ref 0.4–1.5)
Creatinine, Ser: 1.08 mg/dL (ref 0.4–1.5)
Creatinine, Ser: 1.3 mg/dL (ref 0.4–1.5)
Creatinine, Ser: 1.65 mg/dL — ABNORMAL HIGH (ref 0.4–1.5)
GFR calc Af Amer: 53 mL/min — ABNORMAL LOW (ref >60)
GFR calc Af Amer: >60 mL/min (ref >60)
GFR calc Af Amer: >60 mL/min (ref >60)
GFR calc non Af Amer: 40 mL/min — ABNORMAL LOW (ref >60)
GFR calc non Af Amer: 44 mL/min — ABNORMAL LOW (ref >60)
GFR calc non Af Amer: >60 mL/min (ref >60)
Glucose, Bld: 79 mg/dL (ref 70–99)
Glucose, Bld: 82 mg/dL (ref 70–99)
Glucose, Bld: 98 mg/dL (ref 70–99)
Potassium: 4 mEq/L (ref 3.5–5.1)
Potassium: 4.4 mEq/L (ref 3.5–5.1)
Sodium: 138 mEq/L (ref 135–145)
Sodium: 141 mEq/L (ref 135–145)
Total Bilirubin: 1.5 mg/dL — ABNORMAL HIGH (ref 0.3–1.2)
Total Bilirubin: 4 mg/dL — ABNORMAL HIGH (ref 0.3–1.2)
Total Protein: 5.9 g/dL — ABNORMAL LOW (ref 6.0–8.3)

## 2011-01-09 LAB — DIFFERENTIAL
Basophils Absolute: 0 10*3/uL (ref 0.0–0.1)
Basophils Relative: 0 % (ref 0–1)
Eosinophils Absolute: 0 10*3/uL (ref 0.0–0.7)
Eosinophils Relative: 0 % (ref 0–5)
Lymphocytes Relative: 2 % — ABNORMAL LOW (ref 12–46)
Lymphs Abs: 0.2 K/uL — ABNORMAL LOW (ref 0.7–4.0)
Monocytes Absolute: 0.1 10*3/uL (ref 0.1–1.0)
Monocytes Relative: 1 % — ABNORMAL LOW (ref 3–12)
Neutro Abs: 9.6 10*3/uL — ABNORMAL HIGH (ref 1.7–7.7)
Neutrophils Relative %: 96 % — ABNORMAL HIGH (ref 43–77)

## 2011-01-09 LAB — PROTIME-INR
INR: 1.47 (ref 0.00–1.49)
INR: 1.62 — ABNORMAL HIGH (ref 0.00–1.49)
INR: 2.56 — ABNORMAL HIGH (ref 0.00–1.49)
Prothrombin Time: 17.7 seconds — ABNORMAL HIGH (ref 11.6–15.2)
Prothrombin Time: 19.1 seconds — ABNORMAL HIGH (ref 11.6–15.2)
Prothrombin Time: 27.3 s — ABNORMAL HIGH (ref 11.6–15.2)

## 2011-01-09 LAB — CBC
HCT: 32.1 % — ABNORMAL LOW (ref 39.0–52.0)
HCT: 32.6 % — ABNORMAL LOW (ref 39.0–52.0)
HCT: 33 % — ABNORMAL LOW (ref 39.0–52.0)
HCT: 34.3 % — ABNORMAL LOW (ref 39.0–52.0)
Hemoglobin: 11.3 g/dL — ABNORMAL LOW (ref 13.0–17.0)
Hemoglobin: 11.3 g/dL — ABNORMAL LOW (ref 13.0–17.0)
Hemoglobin: 11.8 g/dL — ABNORMAL LOW (ref 13.0–17.0)
MCHC: 33.4 g/dL (ref 30.0–36.0)
MCHC: 34.4 g/dL (ref 30.0–36.0)
MCHC: 34.5 g/dL (ref 30.0–36.0)
MCV: 89.8 fL (ref 78.0–100.0)
MCV: 90 fL (ref 78.0–100.0)
MCV: 90.6 fL (ref 78.0–100.0)
Platelets: 164 10*3/uL (ref 150–400)
Platelets: 165 10*3/uL (ref 150–400)
Platelets: 173 K/uL (ref 150–400)
RBC: 3.63 MIL/uL — ABNORMAL LOW (ref 4.22–5.81)
RBC: 3.82 MIL/uL — ABNORMAL LOW (ref 4.22–5.81)
RDW: 15.1 % (ref 11.5–15.5)
RDW: 15.4 % (ref 11.5–15.5)
RDW: 15.5 % (ref 11.5–15.5)
WBC: 10 K/uL (ref 4.0–10.5)
WBC: 11.4 10*3/uL — ABNORMAL HIGH (ref 4.0–10.5)
WBC: 8.2 10*3/uL (ref 4.0–10.5)

## 2011-01-09 LAB — CARDIAC PANEL(CRET KIN+CKTOT+MB+TROPI)
CK, MB: 1.8 ng/mL (ref 0.3–4.0)
CK, MB: 2.3 ng/mL (ref 0.3–4.0)
CK, MB: 4.7 ng/mL — ABNORMAL HIGH (ref 0.3–4.0)
Relative Index: 3 — ABNORMAL HIGH (ref 0.0–2.5)
Total CK: 125 U/L (ref 7–232)
Total CK: 158 U/L (ref 7–232)
Total CK: 170 U/L (ref 7–232)
Troponin I: 0.02 ng/mL (ref 0.00–0.06)

## 2011-01-09 LAB — RETICULOCYTES
RBC.: 3.81 MIL/uL — ABNORMAL LOW (ref 4.22–5.81)
Retic Count, Absolute: 22.9 10*3/uL (ref 19.0–186.0)
Retic Ct Pct: 0.6 % (ref 0.4–3.1)

## 2011-01-09 LAB — BRAIN NATRIURETIC PEPTIDE: Pro B Natriuretic peptide (BNP): 236 pg/mL — ABNORMAL HIGH (ref 0.0–100.0)

## 2011-01-09 LAB — VITAMIN B12: Vitamin B-12: 580 pg/mL (ref 211–911)

## 2011-01-09 LAB — POCT CARDIAC MARKERS
CKMB, poc: 1 ng/mL (ref 1.0–8.0)
CKMB, poc: 1.3 ng/mL (ref 1.0–8.0)
Myoglobin, poc: 163 ng/mL (ref 12–200)
Myoglobin, poc: 438 ng/mL (ref 12–200)
Troponin i, poc: <0.05 ng/mL (ref 0.00–0.09)
Troponin i, poc: <0.05 ng/mL (ref 0.00–0.09)

## 2011-01-09 LAB — BASIC METABOLIC PANEL
GFR calc non Af Amer: >60 mL/min (ref >60)
Potassium: 4.7 mEq/L (ref 3.5–5.1)
Sodium: 139 mEq/L (ref 135–145)

## 2011-01-09 LAB — COMPREHENSIVE METABOLIC PANEL WITH GFR
ALT: 79 U/L — ABNORMAL HIGH (ref 0–53)
Albumin: 3.5 g/dL (ref 3.5–5.2)
Alkaline Phosphatase: 95 U/L (ref 39–117)
GFR calc Af Amer: 49 mL/min — ABNORMAL LOW (ref >60)
Sodium: 139 meq/L (ref 135–145)
Total Bilirubin: 1.8 mg/dL — ABNORMAL HIGH (ref 0.3–1.2)
Total Protein: 6.8 g/dL (ref 6.0–8.3)

## 2011-01-09 LAB — FERRITIN: Ferritin: 85 ng/mL (ref 22–322)

## 2011-01-09 LAB — LIPASE, BLOOD
Lipase: 1295 U/L — ABNORMAL HIGH (ref 11–59)
Lipase: 756 U/L — ABNORMAL HIGH (ref 11–59)

## 2011-01-09 LAB — LACTATE DEHYDROGENASE: LDH: 346 U/L — ABNORMAL HIGH (ref 94–250)

## 2011-01-09 LAB — IRON AND TIBC: Iron: 22 ug/dL — ABNORMAL LOW (ref 42–135)

## 2011-01-12 LAB — DIFFERENTIAL
Basophils Relative: 1 % (ref 0–1)
Eosinophils Relative: 5 % (ref 0–5)
Lymphocytes Relative: 21 % (ref 12–46)
Lymphs Abs: 1.2 10*3/uL (ref 0.7–4.0)
Lymphs Abs: 1.4 10*3/uL (ref 0.7–4.0)
Monocytes Absolute: 0.4 10*3/uL (ref 0.1–1.0)
Monocytes Relative: 7 % (ref 3–12)
Neutro Abs: 3.5 10*3/uL (ref 1.7–7.7)
Neutro Abs: 4.3 10*3/uL (ref 1.7–7.7)
Neutrophils Relative %: 63 % (ref 43–77)
Neutrophils Relative %: 64 % (ref 43–77)

## 2011-01-12 LAB — BASIC METABOLIC PANEL
CO2: 25 mEq/L (ref 19–32)
CO2: 30 mEq/L (ref 19–32)
CO2: 31 mEq/L (ref 19–32)
Calcium: 8.1 mg/dL — ABNORMAL LOW (ref 8.4–10.5)
Calcium: 8.3 mg/dL — ABNORMAL LOW (ref 8.4–10.5)
Calcium: 8.3 mg/dL — ABNORMAL LOW (ref 8.4–10.5)
Calcium: 8.7 mg/dL (ref 8.4–10.5)
Chloride: 109 mEq/L (ref 96–112)
Creatinine, Ser: 1.02 mg/dL (ref 0.4–1.5)
Creatinine, Ser: 1.16 mg/dL (ref 0.4–1.5)
Creatinine, Ser: 1.3 mg/dL (ref 0.4–1.5)
GFR calc Af Amer: >60 mL/min (ref >60)
GFR calc Af Amer: >60 mL/min (ref >60)
GFR calc Af Amer: >60 mL/min (ref >60)
GFR calc non Af Amer: 53 mL/min — ABNORMAL LOW (ref >60)
GFR calc non Af Amer: 55 mL/min — ABNORMAL LOW (ref >60)
GFR calc non Af Amer: >60 mL/min (ref >60)
Sodium: 139 mEq/L (ref 135–145)
Sodium: 140 mEq/L (ref 135–145)
Sodium: 140 mEq/L (ref 135–145)

## 2011-01-12 LAB — POCT I-STAT, CHEM 8
BUN: 18 mg/dL (ref 6–23)
Chloride: 108 mEq/L (ref 96–112)
Potassium: 4 mEq/L (ref 3.5–5.1)
Sodium: 143 mEq/L (ref 135–145)
TCO2: 28 mmol/L (ref 0–100)

## 2011-01-12 LAB — COMPREHENSIVE METABOLIC PANEL
Alkaline Phosphatase: 77 U/L (ref 39–117)
BUN: 18 mg/dL (ref 6–23)
BUN: 18 mg/dL (ref 6–23)
Calcium: 8.4 mg/dL (ref 8.4–10.5)
Calcium: 8.6 mg/dL (ref 8.4–10.5)
Creatinine, Ser: 1.44 mg/dL (ref 0.4–1.5)
Glucose, Bld: 90 mg/dL (ref 70–99)
Glucose, Bld: 95 mg/dL (ref 70–99)
Potassium: 3.8 mEq/L (ref 3.5–5.1)
Total Protein: 6.4 g/dL (ref 6.0–8.3)
Total Protein: 6.6 g/dL (ref 6.0–8.3)

## 2011-01-12 LAB — HEPARIN LEVEL (UNFRACTIONATED)
Heparin Unfractionated: 0.37 IU/mL (ref 0.30–0.70)
Heparin Unfractionated: 0.38 IU/mL (ref 0.30–0.70)
Heparin Unfractionated: 0.42 IU/mL (ref 0.30–0.70)
Heparin Unfractionated: 0.49 IU/mL (ref 0.30–0.70)

## 2011-01-12 LAB — GLUCOSE, CAPILLARY
Glucose-Capillary: 101 mg/dL — ABNORMAL HIGH (ref 70–99)
Glucose-Capillary: 106 mg/dL — ABNORMAL HIGH (ref 70–99)
Glucose-Capillary: 115 mg/dL — ABNORMAL HIGH (ref 70–99)
Glucose-Capillary: 87 mg/dL (ref 70–99)
Glucose-Capillary: 89 mg/dL (ref 70–99)
Glucose-Capillary: 95 mg/dL (ref 70–99)
Glucose-Capillary: 98 mg/dL (ref 70–99)

## 2011-01-12 LAB — CARDIAC PANEL(CRET KIN+CKTOT+MB+TROPI)
CK, MB: 1.9 ng/mL (ref 0.3–4.0)
Relative Index: INVALID (ref 0.0–2.5)
Total CK: 82 U/L (ref 7–232)
Troponin I: 0.05 ng/mL (ref 0.00–0.06)

## 2011-01-12 LAB — CBC
HCT: 34.2 % — ABNORMAL LOW (ref 39.0–52.0)
HCT: 34.5 % — ABNORMAL LOW (ref 39.0–52.0)
HCT: 35.5 % — ABNORMAL LOW (ref 39.0–52.0)
HCT: 35.7 % — ABNORMAL LOW (ref 39.0–52.0)
Hemoglobin: 10.4 g/dL — ABNORMAL LOW (ref 13.0–17.0)
Hemoglobin: 11 g/dL — ABNORMAL LOW (ref 13.0–17.0)
Hemoglobin: 11.5 g/dL — ABNORMAL LOW (ref 13.0–17.0)
Hemoglobin: 11.7 g/dL — ABNORMAL LOW (ref 13.0–17.0)
Hemoglobin: 11.8 g/dL — ABNORMAL LOW (ref 13.0–17.0)
Hemoglobin: 11.9 g/dL — ABNORMAL LOW (ref 13.0–17.0)
Hemoglobin: 11.9 g/dL — ABNORMAL LOW (ref 13.0–17.0)
MCHC: 32.9 g/dL (ref 30.0–36.0)
MCHC: 33.3 g/dL (ref 30.0–36.0)
MCHC: 33.4 g/dL (ref 30.0–36.0)
MCV: 88.6 fL (ref 78.0–100.0)
MCV: 88.9 fL (ref 78.0–100.0)
Platelets: 205 10*3/uL (ref 150–400)
Platelets: 237 10*3/uL (ref 150–400)
RBC: 3.48 MIL/uL — ABNORMAL LOW (ref 4.22–5.81)
RBC: 3.73 MIL/uL — ABNORMAL LOW (ref 4.22–5.81)
RBC: 3.9 MIL/uL — ABNORMAL LOW (ref 4.22–5.81)
RBC: 3.95 MIL/uL — ABNORMAL LOW (ref 4.22–5.81)
RDW: 16.5 % — ABNORMAL HIGH (ref 11.5–15.5)
RDW: 16.7 % — ABNORMAL HIGH (ref 11.5–15.5)
WBC: 5.6 10*3/uL (ref 4.0–10.5)
WBC: 5.7 10*3/uL (ref 4.0–10.5)
WBC: 6.8 10*3/uL (ref 4.0–10.5)
WBC: 7 10*3/uL (ref 4.0–10.5)
WBC: 8.2 10*3/uL (ref 4.0–10.5)

## 2011-01-12 LAB — LIPID PANEL
HDL: 28 mg/dL — ABNORMAL LOW (ref >39)
LDL Cholesterol: 75 mg/dL (ref 0–99)
Total CHOL/HDL Ratio: 4.1 RATIO
Triglycerides: 63 mg/dL (ref <150)
VLDL: 13 mg/dL (ref 0–40)

## 2011-01-12 LAB — CK TOTAL AND CKMB (NOT AT ARMC)
CK, MB: 2 ng/mL (ref 0.3–4.0)
Total CK: 96 U/L (ref 7–232)

## 2011-01-12 LAB — BRAIN NATRIURETIC PEPTIDE: Pro B Natriuretic peptide (BNP): 457 pg/mL — ABNORMAL HIGH (ref 0.0–100.0)

## 2011-01-12 LAB — PROTIME-INR
INR: 1.2 (ref 0.00–1.49)
INR: 1.2 (ref 0.00–1.49)
INR: 1.25 (ref 0.00–1.49)
INR: 1.33 (ref 0.00–1.49)
INR: 1.39 (ref 0.00–1.49)
INR: 1.4 (ref 0.00–1.49)
INR: 1.7 — ABNORMAL HIGH (ref 0.00–1.49)
Prothrombin Time: 15.1 seconds (ref 11.6–15.2)
Prothrombin Time: 15.1 seconds (ref 11.6–15.2)
Prothrombin Time: 16.4 seconds — ABNORMAL HIGH (ref 11.6–15.2)
Prothrombin Time: 17 seconds — ABNORMAL HIGH (ref 11.6–15.2)
Prothrombin Time: 19.8 seconds — ABNORMAL HIGH (ref 11.6–15.2)

## 2011-01-12 LAB — HEMOCCULT GUIAC POC 1CARD (OFFICE): Fecal Occult Bld: NEGATIVE

## 2011-01-15 LAB — BASIC METABOLIC PANEL
BUN: 20 mg/dL (ref 6–23)
CO2: 30 mEq/L (ref 19–32)
Calcium: 8.8 mg/dL (ref 8.4–10.5)
GFR calc non Af Amer: 50 mL/min — ABNORMAL LOW (ref >60)
Glucose, Bld: 99 mg/dL (ref 70–99)
Potassium: 4.3 mEq/L (ref 3.5–5.1)

## 2011-01-15 LAB — CBC
HCT: 38.1 % — ABNORMAL LOW (ref 39.0–52.0)
Hemoglobin: 12.6 g/dL — ABNORMAL LOW (ref 13.0–17.0)
MCHC: 33 g/dL (ref 30.0–36.0)
MCV: 89.1 fL (ref 78.0–100.0)
RBC: 4.27 MIL/uL (ref 4.22–5.81)

## 2011-01-15 LAB — HEPARIN LEVEL (UNFRACTIONATED): Heparin Unfractionated: 0.36 IU/mL (ref 0.30–0.70)

## 2011-11-01 IMAGING — RF DG CHOLANGIOGRAM OPERATIVE
1 series · 4 of 4 positions shown · non-contrast
Comparison: MRI 02/07/2010

CLINICAL DATA: Biliary pancreatitis

INTRAOPERATIVE CHOLANGIOGRAM
TECHNIQUE: Cholangiographic images from the C-arm fluoroscopic
device were submitted for interpretation post-operatively.  Please
see the procedural report for the amount of contrast and the
fluoroscopy time utilized.

[Series 1: run · 4 of 39 frames shown]
[frame 6/39]
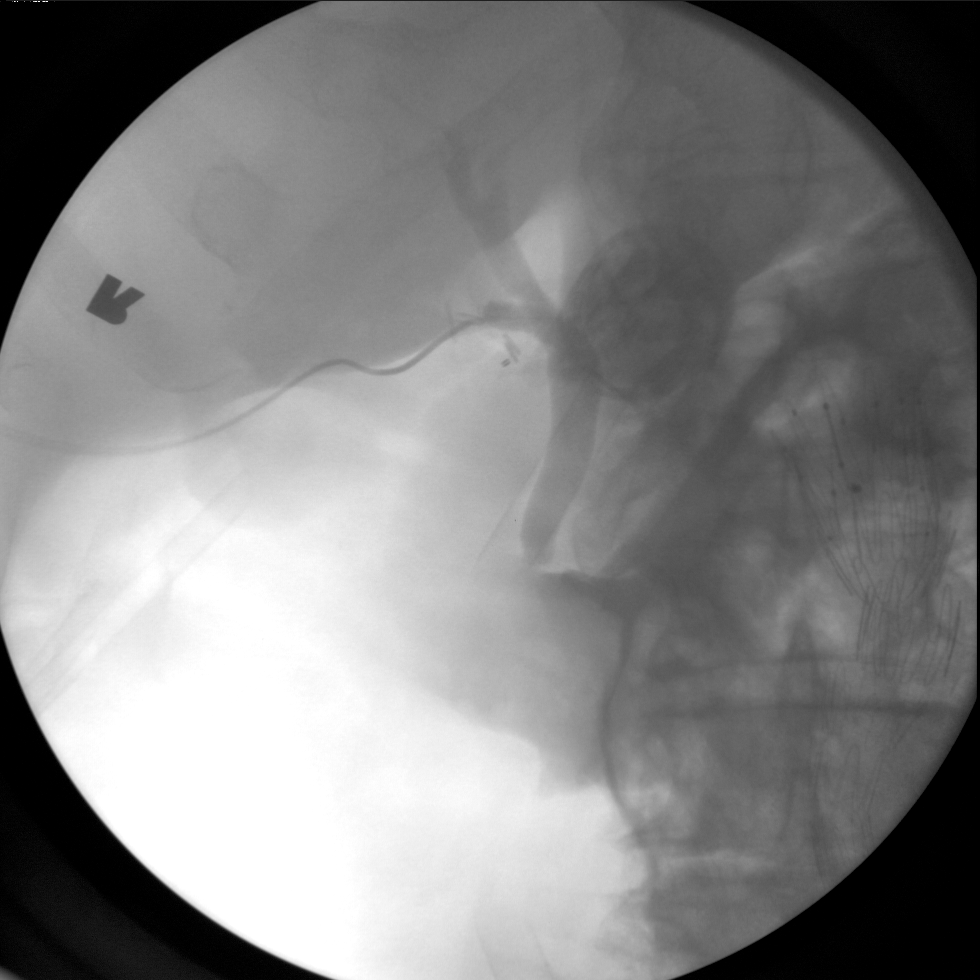
[frame 20/39]
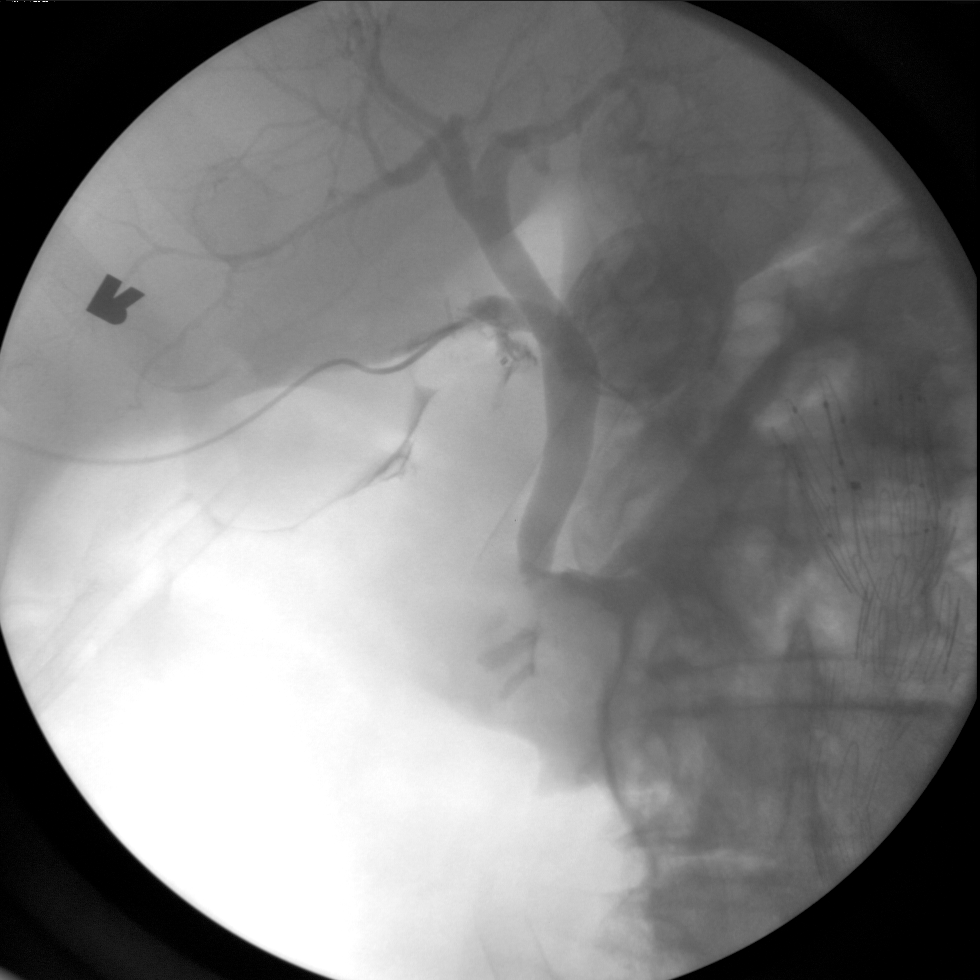
[frame 33/39]
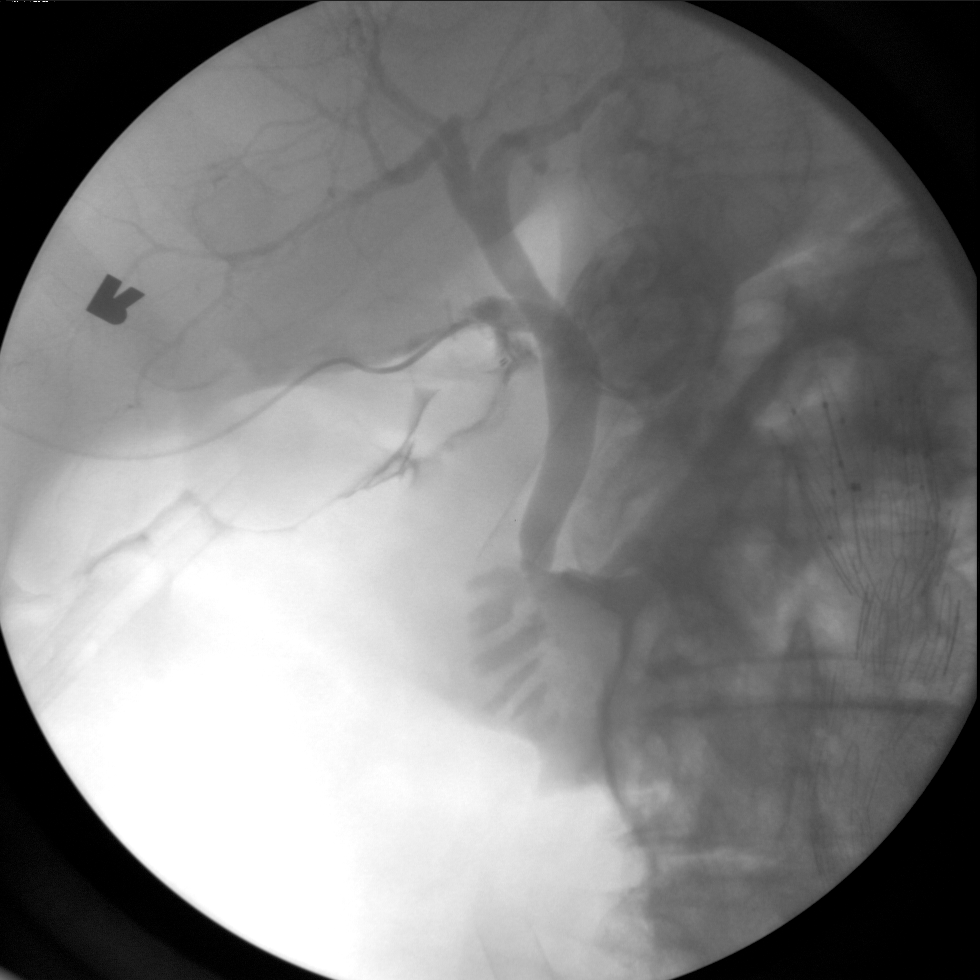
[frame 34/39]
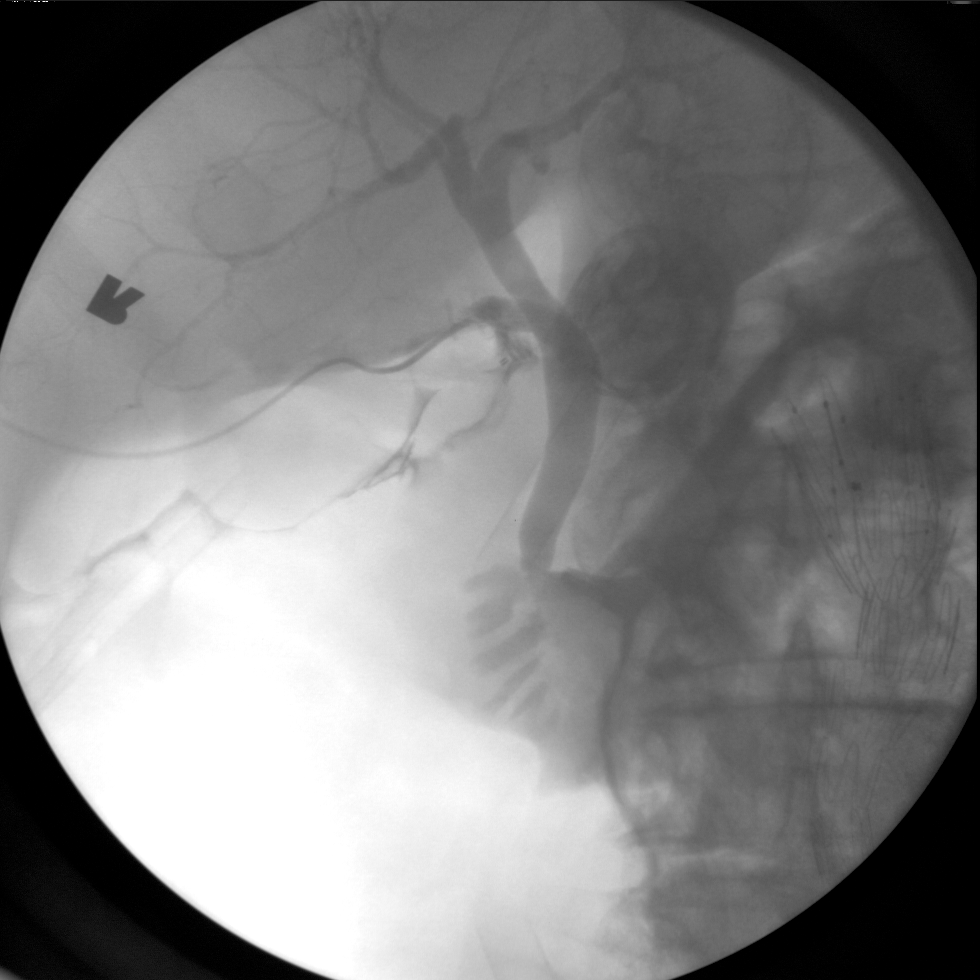

[4 of 4 positions shown; findings below may reference images not displayed]

FINDINGS: Multiple fluoroscopic spot images are provided.  Initial
image demonstrates cannulation of the cystic duct remnant.
Injection of contrast demonstrates filling of the common bile duct
and common hepatic duct.  Contrast flows into the duodenum without
evidence obstruction.  No evidence of filling defect within the
common bile duct.  Within the common hepatic duct, there is a round
filling defect which is felt to most likely representing a gas
bubble.  Small amount of contrast leaks out around the cystic duct
remnant during study.
IMPRESSION: 1.  No clear evidence of retained stone.  Filling defect within the
common hepatic duct is felt most likely to represent an air bubble.
2.  Small amount of  contrast leaks out of the cystic duct remnant
during the study.

## 2013-02-23 ENCOUNTER — Encounter (HOSPITAL_COMMUNITY): Payer: Self-pay | Admitting: Emergency Medicine

## 2013-02-23 ENCOUNTER — Emergency Department (HOSPITAL_COMMUNITY): Payer: Medicare Other

## 2013-02-23 ENCOUNTER — Inpatient Hospital Stay (HOSPITAL_COMMUNITY)
Admission: EM | Admit: 2013-02-23 | Discharge: 2013-02-26 | DRG: 444 | Disposition: A | Discharge status: Home / Self Care | Payer: Medicare Other | Attending: Internal Medicine | Admitting: Internal Medicine

## 2013-02-23 ENCOUNTER — Other Ambulatory Visit: Payer: Self-pay

## 2013-02-23 DIAGNOSIS — R7402 Elevation of levels of lactic acid dehydrogenase (LDH): Secondary | ICD-10-CM | POA: Diagnosis present

## 2013-02-23 DIAGNOSIS — N189 Chronic kidney disease, unspecified: Secondary | ICD-10-CM | POA: Diagnosis present

## 2013-02-23 DIAGNOSIS — I129 Hypertensive chronic kidney disease with stage 1 through stage 4 chronic kidney disease, or unspecified chronic kidney disease: Secondary | ICD-10-CM | POA: Diagnosis present

## 2013-02-23 DIAGNOSIS — Z951 Presence of aortocoronary bypass graft: Secondary | ICD-10-CM

## 2013-02-23 DIAGNOSIS — K8050 Calculus of bile duct without cholangitis or cholecystitis without obstruction: Principal | ICD-10-CM | POA: Diagnosis present

## 2013-02-23 DIAGNOSIS — R7401 Elevation of levels of liver transaminase levels: Secondary | ICD-10-CM | POA: Diagnosis present

## 2013-02-23 DIAGNOSIS — I5032 Chronic diastolic (congestive) heart failure: Secondary | ICD-10-CM | POA: Diagnosis present

## 2013-02-23 DIAGNOSIS — K831 Obstruction of bile duct: Secondary | ICD-10-CM

## 2013-02-23 DIAGNOSIS — I509 Heart failure, unspecified: Secondary | ICD-10-CM | POA: Diagnosis present

## 2013-02-23 DIAGNOSIS — N179 Acute kidney failure, unspecified: Secondary | ICD-10-CM

## 2013-02-23 DIAGNOSIS — E860 Dehydration: Secondary | ICD-10-CM | POA: Diagnosis present

## 2013-02-23 DIAGNOSIS — C111 Malignant neoplasm of posterior wall of nasopharynx: Secondary | ICD-10-CM | POA: Diagnosis present

## 2013-02-23 DIAGNOSIS — R1012 Left upper quadrant pain: Secondary | ICD-10-CM | POA: Diagnosis present

## 2013-02-23 DIAGNOSIS — I4891 Unspecified atrial fibrillation: Secondary | ICD-10-CM

## 2013-02-23 DIAGNOSIS — I2510 Atherosclerotic heart disease of native coronary artery without angina pectoris: Secondary | ICD-10-CM | POA: Diagnosis present

## 2013-02-23 DIAGNOSIS — K859 Acute pancreatitis without necrosis or infection, unspecified: Secondary | ICD-10-CM | POA: Diagnosis present

## 2013-02-23 DIAGNOSIS — Q619 Cystic kidney disease, unspecified: Secondary | ICD-10-CM

## 2013-02-23 DIAGNOSIS — R109 Unspecified abdominal pain: Secondary | ICD-10-CM

## 2013-02-23 HISTORY — DX: Other complications of anesthesia, initial encounter: T88.59XA

## 2013-02-23 HISTORY — DX: Malignant (primary) neoplasm, unspecified: C80.1

## 2013-02-23 HISTORY — DX: Adverse effect of unspecified anesthetic, initial encounter: T41.45XA

## 2013-02-23 HISTORY — DX: Essential (primary) hypertension: I10

## 2013-02-23 HISTORY — DX: Atherosclerotic heart disease of native coronary artery without angina pectoris: I25.10

## 2013-02-23 HISTORY — DX: Unspecified osteoarthritis, unspecified site: M19.90

## 2013-02-23 HISTORY — DX: Disorder of kidney and ureter, unspecified: N28.9

## 2013-02-23 HISTORY — DX: Heart failure, unspecified: I50.9

## 2013-02-23 LAB — PROTIME-INR: Prothrombin Time: 13.2 seconds (ref 11.6–15.2)

## 2013-02-23 LAB — COMPREHENSIVE METABOLIC PANEL
BUN: 28 mg/dL — ABNORMAL HIGH (ref 6–23)
CO2: 26 mEq/L (ref 19–32)
Calcium: 9.1 mg/dL (ref 8.4–10.5)
Chloride: 105 mEq/L (ref 96–112)
Creatinine, Ser: 1.53 mg/dL — ABNORMAL HIGH (ref 0.50–1.35)
GFR calc Af Amer: 46 mL/min — ABNORMAL LOW (ref >90)
GFR calc non Af Amer: 40 mL/min — ABNORMAL LOW (ref >90)
Glucose, Bld: 94 mg/dL (ref 70–99)
Total Bilirubin: 4 mg/dL — ABNORMAL HIGH (ref 0.3–1.2)

## 2013-02-23 LAB — TROPONIN I: Troponin I: <0.3 ng/mL (ref <0.30)

## 2013-02-23 LAB — CBC
HCT: 40.1 % (ref 39.0–52.0)
Hemoglobin: 13.2 g/dL (ref 13.0–17.0)
MCH: 29.7 pg (ref 26.0–34.0)
MCV: 90.3 fL (ref 78.0–100.0)
RBC: 4.44 MIL/uL (ref 4.22–5.81)

## 2013-02-23 LAB — TYPE AND SCREEN

## 2013-02-23 MED ORDER — NITROGLYCERIN 0.4 MG SL SUBL
0.4000 mg | SUBLINGUAL_TABLET | Freq: Three times a day (TID) | SUBLINGUAL | Status: DC | PRN
  Administered 2013-02-24 (×3): 0.4 mg via SUBLINGUAL
  Filled 2013-02-23: qty 25

## 2013-02-23 MED ORDER — HYDROCODONE-ACETAMINOPHEN 5-325 MG PO TABS
1.0000 | ORAL_TABLET | ORAL | Status: DC | PRN
  Administered 2013-02-25: 1 via ORAL
  Filled 2013-02-23: qty 1

## 2013-02-23 MED ORDER — ONDANSETRON HCL 4 MG PO TABS: 4.0000 mg | ORAL_TABLET | Freq: Four times a day (QID) | ORAL | Status: DC | PRN

## 2013-02-23 MED ORDER — ENOXAPARIN SODIUM 30 MG/0.3ML ~~LOC~~ SOLN
30.0000 mg | SUBCUTANEOUS | Status: DC
  Administered 2013-02-23 – 2013-02-24 (×2): 30 mg via SUBCUTANEOUS
  Filled 2013-02-23 (×3): qty 0.3

## 2013-02-23 MED ORDER — SODIUM CHLORIDE 0.9 % IV SOLN: INTRAVENOUS | Status: DC

## 2013-02-23 MED ORDER — NITROGLYCERIN 0.4 MG SL SUBL
SUBLINGUAL_TABLET | SUBLINGUAL | Status: AC
  Administered 2013-02-23: 0.4 mg
  Filled 2013-02-23: qty 25

## 2013-02-23 MED ORDER — ONDANSETRON HCL 4 MG/2ML IJ SOLN
4.0000 mg | Freq: Once | INTRAMUSCULAR | Status: AC
  Administered 2013-02-23: 4 mg via INTRAVENOUS
  Filled 2013-02-23: qty 2

## 2013-02-23 MED ORDER — HYDROMORPHONE HCL PF 1 MG/ML IJ SOLN: 0.5000 mg | INTRAMUSCULAR | Status: DC | PRN

## 2013-02-23 MED ORDER — MORPHINE SULFATE 4 MG/ML IJ SOLN
4.0000 mg | Freq: Once | INTRAMUSCULAR | Status: AC
  Administered 2013-02-23: 4 mg via INTRAVENOUS
  Filled 2013-02-23: qty 1

## 2013-02-23 MED ORDER — ONDANSETRON HCL 4 MG/2ML IJ SOLN: 4.0000 mg | Freq: Four times a day (QID) | INTRAMUSCULAR | Status: DC | PRN

## 2013-02-23 MED ORDER — SODIUM CHLORIDE 0.9 % IV BOLUS (SEPSIS)
500.0000 mL | Freq: Once | INTRAVENOUS | Status: AC
  Administered 2013-02-23: 500 mL via INTRAVENOUS

## 2013-02-23 MED ORDER — PANTOPRAZOLE SODIUM 40 MG IV SOLR
40.0000 mg | Freq: Once | INTRAVENOUS | Status: AC
  Administered 2013-02-23: 40 mg via INTRAVENOUS
  Filled 2013-02-23: qty 40

## 2013-02-23 MED ORDER — SODIUM CHLORIDE 0.9 % IV BOLUS (SEPSIS)
1000.0000 mL | Freq: Once | INTRAVENOUS | Status: AC
  Administered 2013-02-23: 1000 mL via INTRAVENOUS

## 2013-02-23 MED ORDER — SODIUM CHLORIDE 0.9 % IV SOLN
INTRAVENOUS | Status: DC
  Administered 2013-02-23 – 2013-02-26 (×6): via INTRAVENOUS

## 2013-02-23 MED ORDER — ONDANSETRON HCL 4 MG/2ML IJ SOLN: 4.0000 mg | Freq: Three times a day (TID) | INTRAMUSCULAR | Status: DC | PRN

## 2013-02-23 MED ORDER — HYDROMORPHONE HCL PF 1 MG/ML IJ SOLN: 1.0000 mg | INTRAMUSCULAR | Status: DC | PRN

## 2013-02-23 MED ORDER — ZOLPIDEM TARTRATE 5 MG PO TABS
5.0000 mg | ORAL_TABLET | Freq: Every evening | ORAL | Status: DC | PRN
  Administered 2013-02-24: 5 mg via ORAL
  Filled 2013-02-23: qty 1

## 2013-02-23 NOTE — ED Notes (Signed)
Patient reports intermittent R mid side abd pain 10/10. Patient states he has waves of nausea which then trigger the pain. Patient has hx of AAA. Patient VSS at this time.

## 2013-02-23 NOTE — Progress Notes (Signed)
Patient requested nitroglycerin for chest pain stated he takes at home. Provided patient with Nitroglycerin paged physician on call for order. Patient stated his medical records with his medication is suppose to be faxed for his medication list. Had patient sign [facility]. Clarissa will fax over request form for medical records. Nellene Courtois RN

## 2013-02-23 NOTE — Consult Note (Signed)
Reason for Consult: Gallstone pancreatitis Referring Physician: Hospital team  Gregg Hodges is an 79 y.o. male.  HPI: Patient seen at the request of the hospital team for gallstone pancreatitis and hospital computer was reviewed and on Intra-Op cholangiogram a few years ago there was a question of a residual stone versus air bubble and lately he's been having a few short lived attacks but todays did not go away on its own and he presented to the emergency room and we were consulted for further workup and plans he is usually followed at the [place] and is currently being treated for thrush and has had both endoscopies dilations and Botox injection as well as colonoscopies in the past and his family history is negative for any gallbladder problem and he is currently feeling better and has no other complaints Past Medical History  Diagnosis Date  . Arthritis   . Cancer     Malignant adnoid carcinoma (roof of mouth)  . CHF (congestive heart failure)   . Coronary artery disease   . Hypertension   . Renal disorder     kidney stones, resulted in blockage of L kidney, later removed due to damage  . Complication of anesthesia     only to ether    Past Surgical History  Procedure Laterality Date  . Cardiac surgery      CABG  . Cholecystectomy    . Esophagogastroduodenoscopy    . Abdominal aortic aneurysm repair      History reviewed. No pertinent family history.  Social History:  reports that he quit smoking about 46 years ago. His smoking use included Cigarettes. He smoked 0.00 packs per day. He has never used smokeless tobacco. He reports that he does not drink alcohol or use illicit drugs.  Allergies:  Allergies  Allergen Reactions  . Ether     REACTION: Reaction not known  . Isosorbide Mononitrate     REACTION: Blurred vision,and lightheadedness.    Medications: I have reviewed the patient's current medications.  Results for orders placed during the hospital encounter of 02/23/13  (from the past 48 hour(s))  ABO/RH     Status: None   Collection Time    02/23/13  7:00 AM      Result Value Range   ABO/RH(D) A POS    CBC     Status: Abnormal   Collection Time    02/23/13  7:35 AM      Result Value Range   WBC 7.8  4.0 - 10.5 K/uL   RBC 4.44  4.22 - 5.81 MIL/uL   Hemoglobin 13.2  13.0 - 17.0 g/dL   HCT 81.1  91.4 - 78.2 %   MCV 90.3  78.0 - 100.0 fL   MCH 29.7  26.0 - 34.0 pg   MCHC 32.9  30.0 - 36.0 g/dL   RDW 95.6  21.3 - 08.6 %   Platelets 137 (*) 150 - 400 K/uL  COMPREHENSIVE METABOLIC PANEL     Status: Abnormal   Collection Time    02/23/13  7:35 AM      Result Value Range   Sodium 142  135 - 145 mEq/L   Potassium 4.1  3.5 - 5.1 mEq/L   Chloride 105  96 - 112 mEq/L   CO2 26  19 - 32 mEq/L   Glucose, Bld 94  70 - 99 mg/dL   BUN 28 (*) 6 - 23 mg/dL   Creatinine, Ser 5.78 (*) 0.50 - 1.35 mg/dL  Calcium 9.1  8.4 - 10.5 mg/dL   Total Protein 7.2  6.0 - 8.3 g/dL   Albumin 3.5  3.5 - 5.2 g/dL   AST 960 (*) 0 - 37 U/L   ALT 94 (*) 0 - 53 U/L   Alkaline Phosphatase 230 (*) 39 - 117 U/L   Total Bilirubin 4.0 (*) 0.3 - 1.2 mg/dL   GFR calc non Af Amer 40 (*) >90 mL/min   GFR calc Af Amer 46 (*) >90 mL/min   Comment:            The eGFR has been calculated     using the CKD EPI equation.     This calculation has not been     validated in all clinical     situations.     eGFR's persistently     <90 mL/min signify     possible Chronic Kidney Disease.  LIPASE, BLOOD     Status: Abnormal   Collection Time    02/23/13  7:35 AM      Result Value Range   Lipase 917 (*) 11 - 59 U/L  TROPONIN I     Status: None   Collection Time    02/23/13  7:35 AM      Result Value Range   Troponin I <0.30  <0.30 ng/mL   Comment:            Due to the release kinetics of cTnI,     a negative result within the first hours     of the onset of symptoms does not rule out     myocardial infarction with certainty.     If myocardial infarction is still suspected,      repeat the test at appropriate intervals.  PROTIME-INR     Status: None   Collection Time    02/23/13  7:35 AM      Result Value Range   Prothrombin Time 13.2  11.6 - 15.2 seconds   INR 1.01  0.00 - 1.49  TYPE AND SCREEN     Status: None   Collection Time    02/23/13  7:35 AM      Result Value Range   ABO/RH(D) A POS     Antibody Screen NEG     Sample Expiration 02/26/2013      US Abdomen Complete  02/23/2013  *RADIOLOGY REPORT*  Clinical Data:  Pancreatitis, elevated LFTs, history cholecystectomy, question CBD stone/obstruction; past history CHF, hypertension, coronary artery disease  ULTRASOUND ABDOMEN:  Technique:  Sonography of upper abdominal structures was performed.  Comparison:  02/06/2010  Gallbladder:  Surgically absent  Common bile duct:  Dilated measuring 14 mm diameter, previously 6 mm (pre cholecystectomy).  Liver:  Normal echogenicity without mass or nodularity. Hepatopetal portal venous flow.  Intrahepatic biliary dilatation present.  IVC:  Normal appearance  Pancreas:  Normal appearance.  No pancreatic ductal dilatation identified.  Spleen:  Normal appearance, 9.5 cm length  Right kidney:  14.2 cm length.  Normal cortical thickness echogenicity.  Two cysts identified measuring 3.5 x 3.4 x 3.1 cm at mid kidney and 4.5 x 3.9 x 4.2 cm at inferior pole. No hydronephrosis or shadowing calcification.  Left kidney:  No left renal cortex or defined renal margins identified.  At left renal fossa, a large complex fluid collection is identified. Prior report indicates post left nephrectomy; recommend correlation with history.  The observed fluid collection could represent a complicated postoperative collection or, if the patient has not  had a left nephrectomy, longstanding left hydronephrosis with loss of left renal parenchyma.  No solid mass or shadowing calcification.  Aorta:  Normal caliber proximally, obscured at mid and distal portions.  Other:  No ascites  IMPRESSION: Intrahepatic and  extrahepatic biliary dilatation with CBD 14 mm diameter. No discrete CBD stone or pancreatic mass is visualized. Recommend MRCP to assess for distal CBD obstruction.  Right renal cysts. Complex fluid collection left renal fossa question complicated postoperative collection if patient has had prior left nephrectomy versus longstanding left hydronephrosis if the patient has not had prior left renal surgery; recommend correlation with patient history.   Original Report Authenticated By: Ulyses Southward, M.D.    Dg Abd Acute W/chest  02/23/2013  *RADIOLOGY REPORT*  Clinical Data: Nausea and vomiting.  ACUTE ABDOMEN SERIES (ABDOMEN 2 VIEW & CHEST 1 VIEW)  Comparison: 02/09/2010  Findings: Previous median sternotomy and CABG procedure.  The heart size is mildly enlarged.  There is no pleural effusion or edema. No airspace consolidation identified.  Coarsened interstitial markings are identified and appear similar to previous exam.  Metallic scaffolding from aortic stent graft identified.   Cholecystectomy clips noted within the right upper quadrant of the abdomen.  Gas and stool noted within the colon. There is no dilated loop of small bowel or fluid level. Degenerative disc disease is present within the lumbar spine.  IMPRESSION:  1.  Nonobstructive bowel gas pattern. 2.  No acute cardiopulmonary abnormalities.   Original Report Authenticated By: Signa Kell, M.D.     ROS he is on an aspirin a day at home but no other blood thinners Blood pressure 129/56, pulse 67, temperature 98.6 F (37 C), temperature source Oral, resp. rate 18, height 6' (1.829 m), weight 95.255 kg (210 lb), SpO2 94.00%. Physical Exam no acute distress lying comfortably in the bed vital signs stable abdomen is soft very minimal midepigastric discomfort good bowel sound labs reviewed as was ultrasound and previous Intra-Op cholangiogram report  Assessment/Plan: Gallstone pancreatitis Plan: Will allow clear liquids and based on labs and how he  is doing tomorrow we will discuss CT versus MRCP versus ERCP and he and I discussed the risks benefits and methods of ERCP if needed and will follow with you  [facility] E 02/23/2013, 3:51 PM

## 2013-02-23 NOTE — ED Notes (Signed)
Per EMS, patient from home with history of AAA and CABG with RUQ abd pain, described as sharp and tender, tender and guarding upon palpation. Patient denies any distention from baseline. A&Ox3. While en route,  Patient sat up and said "I need some air", reported feeling SOB, placed on NRB, patient reported this helped. Pain 10/10, intermittent. Patient has had L kidney removed and gallbladder.

## 2013-02-23 NOTE — ED Notes (Signed)
Patient transported to X-ray 

## 2013-02-23 NOTE — ED Provider Notes (Signed)
History     CSN: [id number]  Arrival date & time 02/23/13  4098   First MD Initiated Contact with Patient 02/23/13 0700      Chief Complaint  Patient presents with  . Abdominal Pain    (Consider location/radiation/quality/duration/timing/severity/associated sxs/prior treatment) Patient is a 79 y.o. male presenting with abdominal pain. The history is provided by the patient.  Abdominal Pain Associated symptoms: nausea and vomiting   Associated symptoms: no chest pain, no constipation, no diarrhea, no dysuria, no fever, no hematuria and no shortness of breath   pt w hx aaa repair, cholecystectomy, cad/cabg, c/o epigastric and mid to upper abd pain esp on right since approximately 3 am. States had similar episode last week but it passed after several minutes. No radiation. No specific exacerbating or alleviating factors. Constant. Dulll. Mod-severe. Nausea, vomiting a couple times, states 'mucousy'.  No bloody or bilious emesis. No chest pain. No sob. No fever or chills.     Past Medical History  Diagnosis Date  . Arthritis   . Cancer     Malignant adnoid carcinoma (roof of mouth)  . CHF (congestive heart failure)   . Coronary artery disease   . Hypertension   . Renal disorder     kidney stones, resulted in blockage of L kidney, later removed due to damage    Past Surgical History  Procedure Laterality Date  . Cardiac surgery      CABG  . Cholecystectomy    . Esophagogastroduodenoscopy    . Abdominal aortic aneurysm repair      History reviewed. No pertinent family history.  History  Substance Use Topics  . Smoking status: Not on file  . Smokeless tobacco: Not on file  . Alcohol Use: No      Review of Systems  Constitutional: Negative for fever.  HENT: Negative for neck pain.   Eyes: Negative for redness.  Respiratory: Negative for shortness of breath.   Cardiovascular: Negative for chest pain and leg swelling.  Gastrointestinal: Positive for nausea, vomiting  and abdominal pain. Negative for diarrhea and constipation.  Genitourinary: Negative for dysuria, hematuria and flank pain.  Musculoskeletal: Negative for back pain.  Skin: Negative for rash.  Neurological: Negative for headaches.  Hematological: Does not bruise/bleed easily.  Psychiatric/Behavioral: Negative for confusion.    Allergies  Ether and Isosorbide mononitrate  Home Medications  No current outpatient prescriptions on file.  BP 137/43  Pulse 60  Temp(Src) 98.8 F (37.1 C) (Oral)  Resp 28  SpO2 97%  Physical Exam  Nursing note and vitals reviewed. Constitutional: He is oriented to person, place, and time. He appears well-developed and well-nourished. No distress.  HENT:  Mouth/Throat: Oropharynx is clear and moist.  Eyes: Conjunctivae are normal. No scleral icterus.  Neck: Normal range of motion. Neck supple. No tracheal deviation present.  Cardiovascular: Normal rate, regular rhythm, normal heart sounds and intact distal pulses.   Pulmonary/Chest: Effort normal and breath sounds normal. No accessory muscle usage. No respiratory distress.  Abdominal: Soft. Bowel sounds are normal. He exhibits no distension and no mass. There is no rebound and no guarding.  Mid and upper abd tenderness r>l. No rebound or guarding. No incarc hernia.   Genitourinary:  No cva tenderness  Musculoskeletal: Normal range of motion. He exhibits no edema and no tenderness.  Neurological: He is alert and oriented to person, place, and time.  Skin: Skin is warm and dry.  Psychiatric: He has a normal mood and affect.  ED Course  Procedures (including critical care time)  Results for orders placed during the hospital encounter of 02/23/13  CBC      Result Value Range   WBC 7.8  4.0 - 10.5 K/uL   RBC 4.44  4.22 - 5.81 MIL/uL   Hemoglobin 13.2  13.0 - 17.0 g/dL   HCT 29.5  62.1 - 30.8 %   MCV 90.3  78.0 - 100.0 fL   MCH 29.7  26.0 - 34.0 pg   MCHC 32.9  30.0 - 36.0 g/dL   RDW 65.7   84.6 - 96.2 %   Platelets 137 (*) 150 - 400 K/uL  COMPREHENSIVE METABOLIC PANEL      Result Value Range   Sodium 142  135 - 145 mEq/L   Potassium 4.1  3.5 - 5.1 mEq/L   Chloride 105  96 - 112 mEq/L   CO2 26  19 - 32 mEq/L   Glucose, Bld 94  70 - 99 mg/dL   BUN 28 (*) 6 - 23 mg/dL   Creatinine, Ser 9.52 (*) 0.50 - 1.35 mg/dL   Calcium 9.1  8.4 - 84.1 mg/dL   Total Protein 7.2  6.0 - 8.3 g/dL   Albumin 3.5  3.5 - 5.2 g/dL   AST 324 (*) 0 - 37 U/L   ALT 94 (*) 0 - 53 U/L   Alkaline Phosphatase 230 (*) 39 - 117 U/L   Total Bilirubin 4.0 (*) 0.3 - 1.2 mg/dL   GFR calc non Af Amer 40 (*) >90 mL/min   GFR calc Af Amer 46 (*) >90 mL/min  LIPASE, BLOOD      Result Value Range   Lipase 917 (*) 11 - 59 U/L  TROPONIN I      Result Value Range   Troponin I <0.30  <0.30 ng/mL  PROTIME-INR      Result Value Range   Prothrombin Time 13.2  11.6 - 15.2 seconds   INR 1.01  0.00 - 1.49  TYPE AND SCREEN      Result Value Range   ABO/RH(D) A POS     Antibody Screen NEG     Sample Expiration 02/26/2013    ABO/RH      Result Value Range   ABO/RH(D) A POS     US Abdomen Complete  02/23/2013  *RADIOLOGY REPORT*  Clinical Data:  Pancreatitis, elevated LFTs, history cholecystectomy, question CBD stone/obstruction; past history CHF, hypertension, coronary artery disease  ULTRASOUND ABDOMEN:  Technique:  Sonography of upper abdominal structures was performed.  Comparison:  02/06/2010  Gallbladder:  Surgically absent  Common bile duct:  Dilated measuring 14 mm diameter, previously 6 mm (pre cholecystectomy).  Liver:  Normal echogenicity without mass or nodularity. Hepatopetal portal venous flow.  Intrahepatic biliary dilatation present.  IVC:  Normal appearance  Pancreas:  Normal appearance.  No pancreatic ductal dilatation identified.  Spleen:  Normal appearance, 9.5 cm length  Right kidney:  14.2 cm length.  Normal cortical thickness echogenicity.  Two cysts identified measuring 3.5 x 3.4 x 3.1 cm at mid  kidney and 4.5 x 3.9 x 4.2 cm at inferior pole. No hydronephrosis or shadowing calcification.  Left kidney:  No left renal cortex or defined renal margins identified.  At left renal fossa, a large complex fluid collection is identified. Prior report indicates post left nephrectomy; recommend correlation with history.  The observed fluid collection could represent a complicated postoperative collection or, if the patient has not had a left nephrectomy, longstanding left  hydronephrosis with loss of left renal parenchyma.  No solid mass or shadowing calcification.  Aorta:  Normal caliber proximally, obscured at mid and distal portions.  Other:  No ascites  IMPRESSION: Intrahepatic and extrahepatic biliary dilatation with CBD 14 mm diameter. No discrete CBD stone or pancreatic mass is visualized. Recommend MRCP to assess for distal CBD obstruction.  Right renal cysts. Complex fluid collection left renal fossa question complicated postoperative collection if patient has had prior left nephrectomy versus longstanding left hydronephrosis if the patient has not had prior left renal surgery; recommend correlation with patient history.   Original Report Authenticated By: Ulyses Southward, M.D.    Dg Abd Acute W/chest  02/23/2013  *RADIOLOGY REPORT*  Clinical Data: Nausea and vomiting.  ACUTE ABDOMEN SERIES (ABDOMEN 2 VIEW & CHEST 1 VIEW)  Comparison: 02/09/2010  Findings: Previous median sternotomy and CABG procedure.  The heart size is mildly enlarged.  There is no pleural effusion or edema. No airspace consolidation identified.  Coarsened interstitial markings are identified and appear similar to previous exam.  Metallic scaffolding from aortic stent graft identified.   Cholecystectomy clips noted within the right upper quadrant of the abdomen.  Gas and stool noted within the colon. There is no dilated loop of small bowel or fluid level. Degenerative disc disease is present within the lumbar spine.  IMPRESSION:  1.   Nonobstructive bowel gas pattern. 2.  No acute cardiopulmonary abnormalities.   Original Report Authenticated By: Signa Kell, M.D.        MDM  Iv ns. Morphine iv. zofran iv. protonix iv. Labs.  Reviewed nursing notes and prior charts for additional history.    Date: 02/23/2013  Rate: 61  Rhythm: indeterminate  QRS Axis: left  Intervals: normal  ST/T Wave abnormalities: nonspecific ST/T changes  Conduction Disutrbances:right bundle branch block  Narrative Interpretation:   Old EKG Reviewed: changes noted   Recheck pain much improved. Pt comfortable. Nausea improved.  Recheck abd, upper abd tenderness, no rebound or guarding.  Discussed hx, labs, and u/s w gi md on call, Dr Ewing Schlein - he indicates would recommend admit, repeat labs tomorrow am, if lab abn persist/worsen, or clinically pts symptoms fail to improve/resolve, would then consider mrcp and/or other diagnostic workup then.   Dr Ewing Schlein will consult on pt.  [facility] called to admit.         Suzi Roots, MD 02/23/13 1040

## 2013-02-23 NOTE — H&P (Signed)
[facility] History and Physical  Gregg Hodges ZOX:[id number] DOB: [birth year] DOA: 02/23/2013  Referring physician: ED physician PCP: No primary provider on file.   Chief Complaint: Abdominal pain  HPI:  Patient is 79 yo male with no significant past medical history, presented to [facility] ED with progressively worsening left sided abdominal pain that initially started 1-2 days prior to the admission, sharp and intermittent in nature, 10/10 in severity when presents, associated with nausea and poor oral intake, no specific aggravating or alleviating factors. Pt denies similar episodes in the past. Pt denies chest pain or shortness of breath, no urinary concerns, no other systemic symptoms.   In ED, pt with persistent pain, lipase > 900, transaminitis, acute renal failure. [facility] asked to admit for further evaluation and management. GI consulted by ED physician.   Assessment and Plan:  Principal Problem:   Abdominal pain, left upper quadrant - secondary to gallstone pancreatitis - appreciate GI input, ? MRCP vs ERCP, will follow up on recommendations - clear liquid diet tonight and supportive care with analgesia and antiemetics as needed Active Problems:   Pancreatitis, acute - gallstone as noted below, GI following - CMET and lipase in AM, supportive care and follow up on GI rec's   Acute on chronic renal failure - likely pre renal in etiology and secondary to poor oral intake - will hold lasix today and will provide fluids, gentle hydration as pt has diastolic CHF - will likely be able to resume Lasix once pt adequately hydrated    Transaminitis - follow up on CMET in AM   Chronic diastolic heart failure - hold lasix today and provide gently hydration - strict I's and O's, daily weights   Code Status: Full Family Communication: Pt at bedside Disposition Plan: Admit to medical floor   Review of Systems:  Constitutional: Negative for fever, chills and malaise/fatigue. Negative  for diaphoresis.  HENT: Negative for hearing loss, ear pain, nosebleeds, congestion, sore throat, neck pain, tinnitus and ear discharge.   Eyes: Negative for blurred vision, double vision, photophobia, pain, discharge and redness.  Respiratory: Negative for cough, hemoptysis, sputum production, shortness of breath, wheezing and stridor.   Cardiovascular: Negative for chest pain, palpitations, orthopnea, claudication and leg swelling.  Gastrointestinal: Positive for nausea, and abdominal pain. Negative for heartburn, constipation, blood in stool and melena.  Genitourinary: Negative for dysuria, urgency, frequency, hematuria and flank pain.  Musculoskeletal: Negative for myalgias, back pain, joint pain and falls.  Skin: Negative for itching and rash.  Neurological: Negative for dizziness and weakness. Negative for tingling, tremors, sensory change, speech change, focal weakness, loss of consciousness and headaches.  Endo/Heme/Allergies: Negative for environmental allergies and polydipsia. Does not bruise/bleed easily.  Psychiatric/Behavioral: Negative for suicidal ideas. The patient is not nervous/anxious.      Past Medical History  Diagnosis Date  . Arthritis   . Cancer     Malignant adnoid carcinoma (roof of mouth)  . CHF (congestive heart failure)   . Coronary artery disease   . Hypertension   . Renal disorder     kidney stones, resulted in blockage of L kidney, later removed due to damage  . Complication of anesthesia     only to ether    Past Surgical History  Procedure Laterality Date  . Cardiac surgery      CABG  . Cholecystectomy    . Esophagogastroduodenoscopy    . Abdominal aortic aneurysm repair      Social History:  reports that he  quit smoking about 46 years ago. His smoking use included Cigarettes. He smoked 0.00 packs per day. He has never used smokeless tobacco. He reports that he does not drink alcohol or use illicit drugs.  Allergies  Allergen Reactions  .  Ether     REACTION: Reaction not known  . Isosorbide Mononitrate     REACTION: Blurred vision,and lightheadedness.    No specific family medical history.   Prior to Admission medications   Medication Sig Start Date End Date Taking? Authorizing Provider  furosemide (LASIX) 40 MG tablet Take 40 mg by mouth daily.   Yes Historical Provider, MD  PIROXICAM PO Take by mouth.   Yes Historical Provider, MD    Physical Exam: Filed Vitals:   02/23/13 0630 02/23/13 0700 02/23/13 0804 02/23/13 0851  BP: 119/46 135/63 102/61 106/51  Pulse: 59 58 67 52  Temp:   98.5 F (36.9 C)   TempSrc:      Resp: 20 21 18 20   SpO2: 98% 92% 98% 99%    Physical Exam  Constitutional: Appears well-developed and well-nourished. No distress.  HENT: Normocephalic. External right and left ear normal. Oropharynx is clear and moist.  Eyes: Conjunctivae and EOM are normal. PERRLA, no scleral icterus.  Neck: Normal ROM. Neck supple. No JVD. No tracheal deviation. No thyromegaly.  CVS: RRR, S1/S2 +, no murmurs, no gallops, no carotid bruit.  Pulmonary: Effort and breath sounds normal, no stridor, rhonchi, wheezes, rales.  Abdominal: Soft. BS +,  no distension, tenderness in epigastric area, no rebound or guarding.  Musculoskeletal: Normal range of motion. No edema and no tenderness.  Lymphadenopathy: No lymphadenopathy noted, cervical, inguinal. Neuro: Alert. Normal reflexes, muscle tone coordination. No cranial nerve deficit. Skin: Skin is warm and dry. No rash noted. Not diaphoretic. No erythema. No pallor.  Psychiatric: Normal mood and affect. Behavior, judgment, thought content normal.   Labs on Admission:  Basic Metabolic Panel:  Recent Labs Lab 02/23/13 0735  NA 142  K 4.1  CL 105  CO2 26  GLUCOSE 94  BUN 28*  CREATININE 1.53*  CALCIUM 9.1   Liver Function Tests:  Recent Labs Lab 02/23/13 0735  AST 123*  ALT 94*  ALKPHOS 230*  BILITOT 4.0*  PROT 7.2  ALBUMIN 3.5    Recent Labs Lab  02/23/13 0735  LIPASE 917*   CBC:  Recent Labs Lab 02/23/13 0735  WBC 7.8  HGB 13.2  HCT 40.1  MCV 90.3  PLT 137*   Cardiac Enzymes:  Recent Labs Lab 02/23/13 0735  TROPONINI <0.30   Radiological Exams on Admission: US Abdomen Complete 02/23/2013  Intrahepatic and extrahepatic biliary dilatation with CBD 14 mm diameter. No discrete CBD stone or pancreatic mass is visualized.  Recommend MRCP to assess for distal CBD obstruction.   Right renal cysts. Complex fluid collection left renal fossa question complicated postoperative collection if patient has had prior left nephrectomy versus longstanding left hydronephrosis if the patient has not had prior left renal surgery; recommend correlation with patient history.     Dg Abd Acute W/chest 02/23/2013   1.  Nonobstructive bowel gas pattern.  2.  No acute cardiopulmonary abnormalities.   EKG: Normal sinus rhythm, no ST/T wave changes  Debbora Presto, MD  [facility] Pager [phone]  If 7PM-7AM, please contact night-coverage [email or web address] Password [email or web address] 02/23/2013, 12:01 PM

## 2013-02-23 NOTE — ED Notes (Signed)
[id number]<AV> Expected date:<BR> Expected time:<BR> Means of arrival:<BR> Comments:<BR> EMS RUQ abd pain

## 2013-02-24 ENCOUNTER — Other Ambulatory Visit (HOSPITAL_COMMUNITY): Payer: Medicare Other

## 2013-02-24 ENCOUNTER — Inpatient Hospital Stay (HOSPITAL_COMMUNITY): Payer: Medicare Other

## 2013-02-24 DIAGNOSIS — K831 Obstruction of bile duct: Secondary | ICD-10-CM

## 2013-02-24 DIAGNOSIS — K859 Acute pancreatitis without necrosis or infection, unspecified: Secondary | ICD-10-CM

## 2013-02-24 LAB — COMPREHENSIVE METABOLIC PANEL
ALT: 85 U/L — ABNORMAL HIGH (ref 0–53)
AST: 89 U/L — ABNORMAL HIGH (ref 0–37)
Albumin: 2.7 g/dL — ABNORMAL LOW (ref 3.5–5.2)
Chloride: 109 mEq/L (ref 96–112)
Creatinine, Ser: 1.66 mg/dL — ABNORMAL HIGH (ref 0.50–1.35)
Potassium: 4.8 mEq/L (ref 3.5–5.1)
Sodium: 143 mEq/L (ref 135–145)
Total Bilirubin: 6.8 mg/dL — ABNORMAL HIGH (ref 0.3–1.2)

## 2013-02-24 LAB — CBC
MCH: 28.9 pg (ref 26.0–34.0)
MCHC: 31.4 g/dL (ref 30.0–36.0)
MCV: 92 fL (ref 78.0–100.0)
Platelets: 134 10*3/uL — ABNORMAL LOW (ref 150–400)
RBC: 3.74 MIL/uL — ABNORMAL LOW (ref 4.22–5.81)

## 2013-02-24 MED ORDER — AMLODIPINE BESYLATE 5 MG PO TABS
5.0000 mg | ORAL_TABLET | Freq: Every day | ORAL | Status: DC
  Administered 2013-02-24 – 2013-02-26 (×3): 5 mg via ORAL
  Filled 2013-02-24 (×3): qty 1

## 2013-02-24 MED ORDER — SODIUM CHLORIDE 0.9 % IV SOLN
1.5000 g | INTRAVENOUS | Status: AC
  Administered 2013-02-25: 1.5 g via INTRAVENOUS
  Filled 2013-02-24: qty 1.5

## 2013-02-24 MED ORDER — SODIUM CHLORIDE 0.9 % IV SOLN: INTRAVENOUS | Status: DC

## 2013-02-24 NOTE — Progress Notes (Signed)
Gregg Hodges 10:17 AM  Subjective: Patient doing better with minimal soreness only and wants to eat and no new complaints Objective: Vital signs stable afebrile no acute distress abdomen is soft minimal midepigastric discomfort only good bowel sound lipase normal bili increased other liver tests decreased white count slight increased  Assessment: Gallstone pancreatitis  Plan: Will advance diet and return to clears if pain recurs and will proceed with MRCP to rule out CBD stones and continue to follow to see if ERCP is needed  [facility] E

## 2013-02-24 NOTE — Progress Notes (Signed)
Pt unable to remember home medications and doses.  Patient with heart condition history.  Requested mediciation list from [facility] with no results at this time.  Pt asked if has a family member that could bring medicines and doses to hospital.  Patients granddaughter can bring in the morning.  Spoke with pharmacy concerning inability to obtain list and pharmacy to check in the am for list with floor nurse. MD aware and order given for nitroglycerin as patient is able to recall this medicine and dose. Patient and granddaughter verbalized understanding and medicines unable to get restarted until able to verify medicines and doses via home medicine list or [facility] list.

## 2013-02-24 NOTE — Progress Notes (Signed)
[facility] PROGRESS NOTE  Gregg Hodges UXL:[id number] DOB: [birth year] DOA: 02/23/2013 PCP: No primary provider on file.  Assessment/Plan:  Abdominal pain, left upper quadrant  - secondary to gallstone pancreatitis  - gastroenterologist on board. He is on clears . Active Problems:  Pancreatitis, acute  - gallstone as noted below, GI following  - CMET and lipase in AM, supportive care and follow up on GI rec's  Acute on chronic renal failure  - likely pre renal in etiology and secondary to poor oral intake  - will hold lasix today and will provide fluids, gentle hydration as pt has diastolic CHF  - will likely be able to resume Lasix once pt adequately hydrated  Transaminitis  - follow up on CMET in AM  Chronic diastolic heart failure  - hold lasix today and provide gently hydration  - strict I's and O's, daily weights      Code Status: full code Family Communication:none at bedside Disposition Plan: pending.    Consultants:  Gastroenterology.  HPI/Subjective: Wants to go home as soon as possible.   Objective: Filed Vitals:   02/23/13 2121 02/24/13 0500 02/24/13 0646 02/24/13 1400  BP: 118/44 141/62  152/79  Pulse: 52 49  62  Temp: 98.6 F (37 C) 98.4 F (36.9 C)  98.6 F (37 C)  TempSrc: Oral Oral  Oral  Resp: 18 18  18   Height:      Weight:   96.798 kg (213 lb 6.4 oz)   SpO2: 94% 96%  98%    Intake/Output Summary (Last 24 hours) at 02/24/13 1850 Last data filed at 02/24/13 1800  Gross per 24 hour  Intake 1646.25 ml  Output   1000 ml  Net 646.25 ml   Filed Weights   02/23/13 1445 02/24/13 0646  Weight: 95.255 kg (210 lb) 96.798 kg (213 lb 6.4 oz)    Exam: Alert afebrile comfortable.  CVS: RRR, S1/S2 +, no murmurs, no gallops, no carotid bruit.  Pulmonary: Effort and breath sounds normal, no stridor, rhonchi, wheezes, rales.  Abdominal: Soft. BS +, no distension,no ntenderness, no rebound or guarding.  Musculoskeletal: Normal range of  motion. No edema and no tenderness.  Lymphadenopathy: No lymphadenopathy noted, cervical, inguinal. Neuro: Alert. Normal reflexes, muscle tone coordination. No cranial nerve deficit.   Data Reviewed: Basic Metabolic Panel:  Recent Labs Lab 02/23/13 0735 02/24/13 0510  NA 142 143  K 4.1 4.8  CL 105 109  CO2 26 27  GLUCOSE 94 90  BUN 28* 30*  CREATININE 1.53* 1.66*  CALCIUM 9.1 8.5   Liver Function Tests:  Recent Labs Lab 02/23/13 0735 02/24/13 0510  AST 123* 89*  ALT 94* 85*  ALKPHOS 230* 157*  BILITOT 4.0* 6.8*  PROT 7.2 6.0  ALBUMIN 3.5 2.7*    Recent Labs Lab 02/23/13 0735 02/24/13 0510  LIPASE 917* 32   No results found for this basename: AMMONIA,  in the last 168 hours CBC:  Recent Labs Lab 02/23/13 0735 02/24/13 0510  WBC 7.8 12.5*  HGB 13.2 10.8*  HCT 40.1 34.4*  MCV 90.3 92.0  PLT 137* 134*   Cardiac Enzymes:  Recent Labs Lab 02/23/13 0735  TROPONINI <0.30   BNP (last 3 results) No results found for this basename: PROBNP,  in the last 8760 hours CBG: No results found for this basename: GLUCAP,  in the last 168 hours  No results found for this or any previous visit (from the past 240 hour(s)).   Studies: US Abdomen  Complete  02/23/2013  *RADIOLOGY REPORT*  Clinical Data:  Pancreatitis, elevated LFTs, history cholecystectomy, question CBD stone/obstruction; past history CHF, hypertension, coronary artery disease  ULTRASOUND ABDOMEN:  Technique:  Sonography of upper abdominal structures was performed.  Comparison:  02/06/2010  Gallbladder:  Surgically absent  Common bile duct:  Dilated measuring 14 mm diameter, previously 6 mm (pre cholecystectomy).  Liver:  Normal echogenicity without mass or nodularity. Hepatopetal portal venous flow.  Intrahepatic biliary dilatation present.  IVC:  Normal appearance  Pancreas:  Normal appearance.  No pancreatic ductal dilatation identified.  Spleen:  Normal appearance, 9.5 cm length  Right kidney:  14.2 cm  length.  Normal cortical thickness echogenicity.  Two cysts identified measuring 3.5 x 3.4 x 3.1 cm at mid kidney and 4.5 x 3.9 x 4.2 cm at inferior pole. No hydronephrosis or shadowing calcification.  Left kidney:  No left renal cortex or defined renal margins identified.  At left renal fossa, a large complex fluid collection is identified. Prior report indicates post left nephrectomy; recommend correlation with history.  The observed fluid collection could represent a complicated postoperative collection or, if the patient has not had a left nephrectomy, longstanding left hydronephrosis with loss of left renal parenchyma.  No solid mass or shadowing calcification.  Aorta:  Normal caliber proximally, obscured at mid and distal portions.  Other:  No ascites  IMPRESSION: Intrahepatic and extrahepatic biliary dilatation with CBD 14 mm diameter. No discrete CBD stone or pancreatic mass is visualized. Recommend MRCP to assess for distal CBD obstruction.  Right renal cysts. Complex fluid collection left renal fossa question complicated postoperative collection if patient has had prior left nephrectomy versus longstanding left hydronephrosis if the patient has not had prior left renal surgery; recommend correlation with patient history.   Original Report Authenticated By: Ulyses Southward, M.D.    Mr Mrcp  02/24/2013  *RADIOLOGY REPORT*  Clinical Data:  Rule out common bile duct stones.  MRI ABDOMEN WITHOUT CONTRAST (INCLUDING MRCP)  Technique:  Multiplanar multisequence MR imaging of the abdomen was performed both without the administration of intravenous contrast. Heavily T2-weighted images of the biliary and pancreatic ducts were obtained, and three-dimensional MRCP images were rendered by post processing.  Comparison:  02/23/2013  Findings:  Metallic scaffolding from aortic stent graft is identified and causes significant signal void artifact.  There are small bilateral pleural effusions.  Mild intrahepatic bile duct  dilatation is noted.  The common bile duct appears increased in caliber measuring 1.2 cm.  At least two filling defects are present within the distal common bile duct and are concerning for choledocholithiasis.  These measure up to 10 mm.  The visualized portions of the pancreas appear normal.  The visualized portion of the spleen is negative.  The adrenal glands are largely obscured by beam hardening artifact. Multiple right renal cysts are identified.  These are of varying signal intensity and are incompletely characterized without IV contrast material.  Several of these cysts exhibit increased T1 signal suggesting hemorrhagic components.  In the expected location of the left kidney there is a large, complicated cystic mass which is of uncertain significance.  IMPRESSION:  Exam detail is diminished due to signal void artifact from metallic scaffolding from aortic stent graft.  1.  Common bile duct dilatation.  There are at least two stones measuring up to 10 mm within the common bile duct. 2.  Multiple right renal cysts of varying complexity. 3.  Indeterminant complex cystic structure within the expected location of  the left kidney.  This could be better assessed with contrast enhanced CT of the upper abdomen.  These results will be called to the ordering clinician or representative by the Radiologist Assistant, and communication documented in the PACS Dashboard.   Original Report Authenticated By: Signa Kell, M.D.    Mr 3d Recon At Scanner  02/24/2013  *RADIOLOGY REPORT*  Clinical Data:  Rule out common bile duct stones.  MRI ABDOMEN WITHOUT CONTRAST (INCLUDING MRCP)  Technique:  Multiplanar multisequence MR imaging of the abdomen was performed both without the administration of intravenous contrast. Heavily T2-weighted images of the biliary and pancreatic ducts were obtained, and three-dimensional MRCP images were rendered by post processing.  Comparison:  02/23/2013  Findings:  Metallic scaffolding from  aortic stent graft is identified and causes significant signal void artifact.  There are small bilateral pleural effusions.  Mild intrahepatic bile duct dilatation is noted.  The common bile duct appears increased in caliber measuring 1.2 cm.  At least two filling defects are present within the distal common bile duct and are concerning for choledocholithiasis.  These measure up to 10 mm.  The visualized portions of the pancreas appear normal.  The visualized portion of the spleen is negative.  The adrenal glands are largely obscured by beam hardening artifact. Multiple right renal cysts are identified.  These are of varying signal intensity and are incompletely characterized without IV contrast material.  Several of these cysts exhibit increased T1 signal suggesting hemorrhagic components.  In the expected location of the left kidney there is a large, complicated cystic mass which is of uncertain significance.  IMPRESSION:  Exam detail is diminished due to signal void artifact from metallic scaffolding from aortic stent graft.  1.  Common bile duct dilatation.  There are at least two stones measuring up to 10 mm within the common bile duct. 2.  Multiple right renal cysts of varying complexity. 3.  Indeterminant complex cystic structure within the expected location of the left kidney.  This could be better assessed with contrast enhanced CT of the upper abdomen.  These results will be called to the ordering clinician or representative by the Radiologist Assistant, and communication documented in the PACS Dashboard.   Original Report Authenticated By: Signa Kell, M.D.    Dg Abd Acute W/chest  02/23/2013  *RADIOLOGY REPORT*  Clinical Data: Nausea and vomiting.  ACUTE ABDOMEN SERIES (ABDOMEN 2 VIEW & CHEST 1 VIEW)  Comparison: 02/09/2010  Findings: Previous median sternotomy and CABG procedure.  The heart size is mildly enlarged.  There is no pleural effusion or edema. No airspace consolidation identified.   Coarsened interstitial markings are identified and appear similar to previous exam.  Metallic scaffolding from aortic stent graft identified.   Cholecystectomy clips noted within the right upper quadrant of the abdomen.  Gas and stool noted within the colon. There is no dilated loop of small bowel or fluid level. Degenerative disc disease is present within the lumbar spine.  IMPRESSION:  1.  Nonobstructive bowel gas pattern. 2.  No acute cardiopulmonary abnormalities.   Original Report Authenticated By: Signa Kell, M.D.     Scheduled Meds: . amLODipine  5 mg Oral Daily  . [START ON 02/25/2013] ampicillin-sulbactam (UNASYN) 1.5 g IVPB  1.5 g Intravenous On Call  . enoxaparin (LOVENOX) injection  30 mg Subcutaneous Q24H   Continuous Infusions: . sodium chloride 75 mL/hr at 02/24/13 1445  . sodium chloride    . sodium chloride      Principal  Problem:   Abdominal pain, left upper quadrant Active Problems:   Chronic diastolic heart failure   Pancreatitis, acute   Acute on chronic renal failure   Transaminitis        Kingjames Coury  [facility] Pager [phone]. If 7PM-7AM, please contact night-coverage at [email or web address], password [facility] 02/24/2013, 6:50 PM  LOS: 1 day

## 2013-02-24 NOTE — Care Management Note (Signed)
    Page 1 of 1   02/24/2013     12:05:16 PM   CARE MANAGEMENT NOTE 02/24/2013  Patient:  Gregg Hodges, Gregg Hodges   Account Number:  [id number]  Date Initiated:  02/24/2013  Documentation initiated by:  Lorenda Ishihara  Subjective/Objective Assessment:   79 yo male admitted with pancreatitis. PTA lived at home with spouse.     Action/Plan:   Home when stable   Anticipated DC Date:  02/27/2013   Anticipated DC Plan:  HOME/SELF CARE      DC Planning Services  CM consult      Choice offered to / List presented to:             Status of service:  Completed, signed off Medicare Important Message given?   (If response is "NO", the following Medicare IM given date fields will be blank) Date Medicare IM given:   Date Additional Medicare IM given:    Discharge Disposition:  HOME/SELF CARE  Per UR Regulation:  Reviewed for med. necessity/level of care/duration of stay  If discussed at Long Length of Stay Meetings, dates discussed:    Comments:

## 2013-02-25 ENCOUNTER — Inpatient Hospital Stay (HOSPITAL_COMMUNITY): Payer: Medicare Other | Admitting: Registered Nurse

## 2013-02-25 ENCOUNTER — Inpatient Hospital Stay (HOSPITAL_COMMUNITY): Payer: Medicare Other

## 2013-02-25 ENCOUNTER — Encounter (HOSPITAL_COMMUNITY)
Admission: EM | Disposition: A | Discharge status: Home / Self Care | Payer: Self-pay | Source: Home / Self Care | Attending: Internal Medicine

## 2013-02-25 ENCOUNTER — Encounter (HOSPITAL_COMMUNITY): Payer: Self-pay | Admitting: Registered Nurse

## 2013-02-25 DIAGNOSIS — K8050 Calculus of bile duct without cholangitis or cholecystitis without obstruction: Secondary | ICD-10-CM | POA: Diagnosis present

## 2013-02-25 DIAGNOSIS — N189 Chronic kidney disease, unspecified: Secondary | ICD-10-CM

## 2013-02-25 DIAGNOSIS — N179 Acute kidney failure, unspecified: Secondary | ICD-10-CM

## 2013-02-25 HISTORY — PX: ERCP: SHX5425

## 2013-02-25 LAB — CBC WITH DIFFERENTIAL/PLATELET
Basophils Relative: 0 % (ref 0–1)
Eosinophils Absolute: 0.2 10*3/uL (ref 0.0–0.7)
Eosinophils Relative: 2 % (ref 0–5)
HCT: 35.1 % — ABNORMAL LOW (ref 39.0–52.0)
Hemoglobin: 10.9 g/dL — ABNORMAL LOW (ref 13.0–17.0)
Lymphs Abs: 1 10*3/uL (ref 0.7–4.0)
MCH: 28.5 pg (ref 26.0–34.0)
MCHC: 31.1 g/dL (ref 30.0–36.0)
MCV: 91.9 fL (ref 78.0–100.0)
Monocytes Absolute: 0.6 10*3/uL (ref 0.1–1.0)
Monocytes Relative: 5 % (ref 3–12)
RBC: 3.82 MIL/uL — ABNORMAL LOW (ref 4.22–5.81)

## 2013-02-25 LAB — COMPREHENSIVE METABOLIC PANEL
Albumin: 2.7 g/dL — ABNORMAL LOW (ref 3.5–5.2)
Alkaline Phosphatase: 171 U/L — ABNORMAL HIGH (ref 39–117)
BUN: 30 mg/dL — ABNORMAL HIGH (ref 6–23)
Creatinine, Ser: 1.5 mg/dL — ABNORMAL HIGH (ref 0.50–1.35)
GFR calc Af Amer: 47 mL/min — ABNORMAL LOW (ref >90)
Glucose, Bld: 78 mg/dL (ref 70–99)
Potassium: 4.1 mEq/L (ref 3.5–5.1)
Total Protein: 6.3 g/dL (ref 6.0–8.3)

## 2013-02-25 SURGERY — ERCP, WITH INTERVENTION IF INDICATED
Anesthesia: General

## 2013-02-25 MED ORDER — FENTANYL CITRATE 0.05 MG/ML IJ SOLN
INTRAMUSCULAR | Status: DC | PRN
  Administered 2013-02-25 (×2): 25 ug via INTRAVENOUS
  Administered 2013-02-25: 50 ug via INTRAVENOUS

## 2013-02-25 MED ORDER — SUCCINYLCHOLINE CHLORIDE 20 MG/ML IJ SOLN
INTRAMUSCULAR | Status: DC | PRN
  Administered 2013-02-25: 100 mg via INTRAVENOUS

## 2013-02-25 MED ORDER — PROPOFOL 10 MG/ML IV BOLUS
INTRAVENOUS | Status: DC | PRN
  Administered 2013-02-25: 90 mg via INTRAVENOUS
  Administered 2013-02-25: 20 mg via INTRAVENOUS

## 2013-02-25 MED ORDER — FENTANYL CITRATE 0.05 MG/ML IJ SOLN: 25.0000 ug | INTRAMUSCULAR | Status: DC | PRN

## 2013-02-25 MED ORDER — ATROPINE SULFATE 0.4 MG/ML IJ SOLN
INTRAMUSCULAR | Status: DC | PRN
  Administered 2013-02-25: .4 mg via INTRAVENOUS

## 2013-02-25 MED ORDER — ONDANSETRON HCL 4 MG/2ML IJ SOLN
INTRAMUSCULAR | Status: DC | PRN
  Administered 2013-02-25: 4 mg via INTRAVENOUS

## 2013-02-25 MED ORDER — ENOXAPARIN SODIUM 40 MG/0.4ML ~~LOC~~ SOLN
40.0000 mg | SUBCUTANEOUS | Status: DC
  Filled 2013-02-25 (×2): qty 0.4

## 2013-02-25 MED ORDER — SODIUM CHLORIDE 0.9 % IV SOLN
INTRAVENOUS | Status: DC | PRN
  Administered 2013-02-25: 15:00:00

## 2013-02-25 NOTE — Progress Notes (Signed)
[facility] PROGRESS NOTE  CELESTER LECH ZOX:[id number] DOB: 1925-11-11 DOA: 02/23/2013 PCP: [facility]  Principal Problem:   Abdominal pain, left upper quadrant Active Problems:   Chronic diastolic heart failure   Pancreatitis, acute   Acute on chronic renal failure   Transaminitis   Assessment/Plan:  Acute Gallstone Pancreatitis Stable. Status post ERCP and sphincterotomy and stone removal. Asymptomatic currently. Clear liquids. Lipase normal. Further management per GI.  Choledocholithiasis As above  Acute on chronic renal failure  Creatinine slightly better. Making urine. Likely pre renal in etiology and secondary to poor oral intake. Holding Lasix and on gentle IVF. Will likely be able to resume Lasix once pt adequately hydrated.  Transaminitis  Secondary to gallstones. Improving.   Chronic diastolic heart failure  Holding due to dehydration. Gentle hydration. Strict I's and O's, daily weights.  Code Status: full code Family Communication: Discussed with patient.  Disposition Plan: Likely return home tomorrow if he remains well.    Consultants:  Gastroenterology.  HPI/Subjective: No complaints. Returned from ERCP. No problems with procedure.  Objective: Filed Vitals:   02/25/13 1600 02/25/13 1615 02/25/13 1630 02/25/13 1657  BP: 147/60 158/64 161/70 164/61  Pulse: 49 51 52 53  Temp:  97.8 F (36.6 C) 97.8 F (36.6 C) 98.6 F (37 C)  TempSrc:    Oral  Resp: 20 23 21 18   Height:      Weight:      SpO2: 100% 99% 99% 98%    Intake/Output Summary (Last 24 hours) at 02/25/13 1756 Last data filed at 02/25/13 1630  Gross per 24 hour  Intake   2290 ml  Output   1475 ml  Net    815 ml   Filed Weights   02/23/13 1445 02/24/13 0646  Weight: 95.255 kg (210 lb) 96.798 kg (213 lb 6.4 oz)    Exam: Alert afebrile comfortable.  CVS: RRR, S1/S2 +, no murmurs, no gallops, no carotid bruit.  Pulmonary: Effort and breath sounds normal, no stridor, rhonchi,  wheezes, rales.  Abdominal: Soft. BS +, no distension,no ntenderness, no rebound or guarding.  Musculoskeletal: Normal range of motion. No edema and no tenderness.  Lymphadenopathy: No lymphadenopathy noted, cervical, inguinal. Neuro: Alert. Normal reflexes, muscle tone coordination. No cranial nerve deficit.   Data Reviewed: Basic Metabolic Panel:  Recent Labs Lab 02/23/13 0735 02/24/13 0510 02/25/13 0519  NA 142 143 142  K 4.1 4.8 4.1  CL 105 109 108  CO2 26 27 24   GLUCOSE 94 90 78  BUN 28* 30* 30*  CREATININE 1.53* 1.66* 1.50*  CALCIUM 9.1 8.5 8.5   Liver Function Tests:  Recent Labs Lab 02/23/13 0735 02/24/13 0510 02/25/13 0519  AST 123* 89* 51*  ALT 94* 85* 66*  ALKPHOS 230* 157* 171*  BILITOT 4.0* 6.8* 2.8*  PROT 7.2 6.0 6.3  ALBUMIN 3.5 2.7* 2.7*    Recent Labs Lab 02/23/13 0735 02/24/13 0510  LIPASE 917* 32   No results found for this basename: AMMONIA,  in the last 168 hours CBC:  Recent Labs Lab 02/23/13 0735 02/24/13 0510 02/25/13 0519  WBC 7.8 12.5* 12.3*  NEUTROABS  --   --  10.4*  HGB 13.2 10.8* 10.9*  HCT 40.1 34.4* 35.1*  MCV 90.3 92.0 91.9  PLT 137* 134* 164   Cardiac Enzymes:  Recent Labs Lab 02/23/13 0735  TROPONINI <0.30   Studies: Mr Mrcp  02/24/2013  *RADIOLOGY REPORT*  Clinical Data:  Rule out common bile duct stones.  MRI  ABDOMEN WITHOUT CONTRAST (INCLUDING MRCP)  Technique:  Multiplanar multisequence MR imaging of the abdomen was performed both without the administration of intravenous contrast. Heavily T2-weighted images of the biliary and pancreatic ducts were obtained, and three-dimensional MRCP images were rendered by post processing.  Comparison:  02/23/2013  Findings:  Metallic scaffolding from aortic stent graft is identified and causes significant signal void artifact.  There are small bilateral pleural effusions.  Mild intrahepatic bile duct dilatation is noted.  The common bile duct appears increased in caliber  measuring 1.2 cm.  At least two filling defects are present within the distal common bile duct and are concerning for choledocholithiasis.  These measure up to 10 mm.  The visualized portions of the pancreas appear normal.  The visualized portion of the spleen is negative.  The adrenal glands are largely obscured by beam hardening artifact. Multiple right renal cysts are identified.  These are of varying signal intensity and are incompletely characterized without IV contrast material.  Several of these cysts exhibit increased T1 signal suggesting hemorrhagic components.  In the expected location of the left kidney there is a large, complicated cystic mass which is of uncertain significance.  IMPRESSION:  Exam detail is diminished due to signal void artifact from metallic scaffolding from aortic stent graft.  1.  Common bile duct dilatation.  There are at least two stones measuring up to 10 mm within the common bile duct. 2.  Multiple right renal cysts of varying complexity. 3.  Indeterminant complex cystic structure within the expected location of the left kidney.  This could be better assessed with contrast enhanced CT of the upper abdomen.  These results will be called to the ordering clinician or representative by the Radiologist Assistant, and communication documented in the PACS Dashboard.   Original Report Authenticated By: Signa Kell, M.D.    Mr 3d Recon At Scanner  02/24/2013  *RADIOLOGY REPORT*  Clinical Data:  Rule out common bile duct stones.  MRI ABDOMEN WITHOUT CONTRAST (INCLUDING MRCP)  Technique:  Multiplanar multisequence MR imaging of the abdomen was performed both without the administration of intravenous contrast. Heavily T2-weighted images of the biliary and pancreatic ducts were obtained, and three-dimensional MRCP images were rendered by post processing.  Comparison:  02/23/2013  Findings:  Metallic scaffolding from aortic stent graft is identified and causes significant signal void  artifact.  There are small bilateral pleural effusions.  Mild intrahepatic bile duct dilatation is noted.  The common bile duct appears increased in caliber measuring 1.2 cm.  At least two filling defects are present within the distal common bile duct and are concerning for choledocholithiasis.  These measure up to 10 mm.  The visualized portions of the pancreas appear normal.  The visualized portion of the spleen is negative.  The adrenal glands are largely obscured by beam hardening artifact. Multiple right renal cysts are identified.  These are of varying signal intensity and are incompletely characterized without IV contrast material.  Several of these cysts exhibit increased T1 signal suggesting hemorrhagic components.  In the expected location of the left kidney there is a large, complicated cystic mass which is of uncertain significance.  IMPRESSION:  Exam detail is diminished due to signal void artifact from metallic scaffolding from aortic stent graft.  1.  Common bile duct dilatation.  There are at least two stones measuring up to 10 mm within the common bile duct. 2.  Multiple right renal cysts of varying complexity. 3.  Indeterminant complex cystic structure within  the expected location of the left kidney.  This could be better assessed with contrast enhanced CT of the upper abdomen.  These results will be called to the ordering clinician or representative by the Radiologist Assistant, and communication documented in the PACS Dashboard.   Original Report Authenticated By: Signa Kell, M.D.    Dg Ercp Biliary & Pancreatic Ducts  02/25/2013  *RADIOLOGY REPORT*  Clinical Data: Common bile duct stone  ERCP  Technique:  C-arm fluoroscopic images were obtained intraoperatively and submitted for postoperative interpretation. Please see the performing provider's procedural report for the fluoroscopy time utilized.  Comparison: MRCP 02/24/2013.  Ultrasound  abdomen 02/23/2013  Findings: C-arm films document  cannulation of the common bile duct with removal of stones following sphincterotomy and balloon sweep of the duct.  The final film displays dilated CBD without visible filling defects.  IMPRESSION: ERCP with sphincterotomy as described.   Original Report Authenticated By: Davonna Belling, M.D.     Scheduled Meds: . amLODipine  5 mg Oral Daily  . enoxaparin (LOVENOX) injection  40 mg Subcutaneous Q24H   Continuous Infusions: . sodium chloride 75 mL/hr at 02/25/13 0603      [facility]  [facility] Pager [phone].   If 7PM-7AM, please contact night-coverage at [email or web address], password [facility] 02/25/2013, 5:56 PM  LOS: 2 days

## 2013-02-25 NOTE — Transfer of Care (Signed)
Immediate Anesthesia Transfer of Care Note  Patient: Gregg Hodges  Procedure(s) Performed: Procedure(s): ENDOSCOPIC RETROGRADE CHOLANGIOPANCREATOGRAPHY (ERCP) (N/A)  Patient Location: PACU  Anesthesia Type:General  Level of Consciousness: awake, alert  and oriented  Airway & Oxygen Therapy: Patient Spontanous Breathing and Patient connected to face mask oxygen  Post-op Assessment: Report given to PACU RN, Post -op Vital signs reviewed and stable and Patient moving all extremities X 4  Post vital signs: Reviewed and stable  Complications: No apparent anesthesia complications

## 2013-02-25 NOTE — Progress Notes (Signed)
Gregg Hodges 1:36 PM  Subjective: Patient doing well without any new complaints  Objective: Vital signs stable afebrile no acute distress physical exam please see pre-assessment evaluation labs stable liver tests decreased  Assessment: Positive CBD stones  Plan: Okay to proceed with endoscopy and ERCP under anesthesia  Belford Pascucci E

## 2013-02-25 NOTE — Progress Notes (Signed)
Dr. Council Mechanic made aware of patient's heart rates being in the 40s at times.

## 2013-02-25 NOTE — Op Note (Signed)
[facility] [address] [place], [address]   ERCP PROCEDURE REPORT  PATIENT: Gregg Hodges, Gregg Hodges.  MR# :[id number] BIRTHDATE: [birth year]  GENDER: Male ENDOSCOPIST: Vida Rigger, MD REFERRED BY: PROCEDURE DATE:  02/25/2013 PROCEDURE:   ERCP with sphincterotomy/papillotomy and ERCP with removal of calculus/calculi ASA CLASS:    3 INDICATIONS: Positive MRCP and gallstone pancreatitis resolved MEDICATIONS:   General anesthesia TOPICAL ANESTHETIC:  No  DESCRIPTION OF PROCEDURE:   After the risks benefits and alternatives of the procedure were thoroughly explained, informed consent was obtained. Initially a regular endoscopy was performed Due  to his history of dysphasia and history of Botox injection and dilations in the pastAnd it did look like he had achalasia at the GE junction but no other pathology was seen on quick exam to the second portion of the duodenum and no obvious Zenker's diverticulum or other esophageal stricture or ring was seen we then advanced The PENTAX [id number]  endoscope was introduced through the mouth and advanced to the second portion of the duodenumWithout problems And a   normal-appearing ampulla was brought into view And using the triple-lumen sphincterotome loaded with a JAG Jagwire deep selective cannulation was obtained on the first attempt and there was no PD injections or wire advanceMents throughout the procedure.There was an obvious CBD stoneAnd the CBD and intrahepatics were dilatedAnd we went ahead and proceeded with a medium to large sphincterotomy in the customary fashion which was later increased after popping the balloon on the first sweep and we had adequate biliary drainage andCould get theFully bowed sphincter tone easily in and out of the duct. Once we increased the sphincterotomy size we proceeded with multiple balloon pull-through's using the adjustable balloon inflated to 15 mm and to get the 2 large stones out we  decreased it to 12 mm at the ampulla and once the 2 large stones were removed multiple 15 mm balloon pull-through's were done without resistance or debris we tried to proceed with an occlusion cholangiogram in the customary fashion but there was too much air in the system to get great picture but no obvious residual stone was seen and multiple 15 mm balloon pull-throughs Were normal. The patient tolerated the procedure well the scope was removed there was no obvious immediate complication      COMPLICATIONS:   None  ENDOSCOPIC IMPRESSION:1. Achalasia looking esophagus without any other obvious abnormality on initial endoscopy 2. No PD injections or wire advanced 3 2 large stones 2 small stones removedAfter sphincterotomy x2 and multiple balloon pull-throughs as above4. Otherwise within normal limits EGD and ERCP  RECOMMENDATIONS:Customary post-ERCP orders if doing well tomorrow can slowly advance diet and possibly even go home and happy to see back when necessary     _______________________________ eSigned:  Vida Rigger, MD 02/25/2013 3:46 PM   CC:  PATIENT NAME:  Gregg Hodges, Gregg Hodges MR#: [id number]

## 2013-02-25 NOTE — Clinical Documentation Improvement (Signed)
CKD DOCUMENTATION CLARIFICATION QUERY   THIS DOCUMENT IS NOT A PERMANENT PART OF THE MEDICAL RECORD  TO RESPOND TO THE THIS QUERY, FOLLOW THE INSTRUCTIONS BELOW:  1. If needed, update documentation for the patient's encounter via the notes activity.  2. Access this query again and click edit on the In [employer].  3. After updating, or not, click F2 to complete all highlighted (required) fields concerning your review. Select "additional documentation in the medical record" OR "no additional documentation provided".  4. Click Sign note button.  5. The deficiency will fall out of your In Basket *Please let us know if you are not able to complete this workflow by phone or e-mail (listed below).  Please update your documentation within the medical record to reflect your response to this query.                                                                                        02/25/13   Dear Dr.G. Krishnan:/Associates,  In a better effort to capture your patient's severity of illness, reflect appropriate length of stay and utilization of resources, a review of the patient medical record has revealed the following indicators.    Based on your clinical judgment, please clarify and document in a progress note and/or discharge summary the clinical condition associated with the following supporting information:  In responding to this query please exercise your independent judgment.  The fact that a query is asked, does not imply that any particular answer is desired or expected. According to progress notes pt has acute  on chronic renal failure. Please clarify  specificity of stage of CKD.  Possible Clinical Conditions?   _______CKD Stage I -  GFR > OR = 90 _______CKD Stage II - GFR 60-80 _______CKD Stage III - GFR 30-59 _______CKD Stage IV - GFR 15-29 _______CKD Stage V - GFR < 15 _______ESRD (End Stage Renal Disease) _______Other condition_____________ _______Cannot Clinically  determine  Please clarify any known or suspected underlying cause of chronic kidney disease such as:    Diabetes    Hypertension    Other (please specify Supporting Information: Risk Factors:CHF, CAD, Renal disorder >kidney stones, resulted in blockage of L kidney, later removed due to damage   Signs & Symptoms: per H&P:"persistent pain, lipase > 900, transaminitis, acute renal failure" Acute on chronic renal failure  - likely pre renal in etiology and secondary to poor oral intake   Diagnostics:  Lab: BUN 28 (*)    Creatinine, Ser 1.53 (*)        Treatment: 03/05/13 "will hold lasix today and will provide fluids, gentle hydration"                                   strict I's and O's, daily weights    You may use possible, probable, or suspect with inpatient documentation. possible, probable, suspected diagnoses MUST be documented at the time of discharge  Reviewed:  no additional documentation provided   Thank You,  Andy Gauss  RN, BSN Clinical Documentation Specialist:  Pager [phone] Office [phone]  [employer] [facility] Information Management [place] NOT MY PATIENT

## 2013-02-25 NOTE — Anesthesia Preprocedure Evaluation (Addendum)
Anesthesia Evaluation  Patient identified by MRN, date of birth, ID band Patient awake  General Assessment Comment: Abdominal pain, left upper quadrant - secondary to gallstone pancreatitis - appreciate GI input, ? MRCP vs ERCP, will follow up on recommendations - clear liquid diet tonight and supportive care with analgesia and antiemetics as needed Active Problems:   Pancreatitis, acute - gallstone as noted below, GI following - CMET and lipase in AM, supportive care and follow up on GI rec's   Acute on chronic renal failure - likely pre renal in etiology and secondary to poor oral intake - will hold lasix today and will provide fluids, gentle hydration as pt has diastolic CHF - will likely be able to resume Lasix once pt adequately hydrated    Transaminitis - follow up on CMET in AM   Chronic diastolic heart failure   Reviewed: Allergy & Precautions, H&P , NPO status , Patient's Chart, lab work & pertinent test results  Airway Mallampati: II TM Distance: >3 FB Neck ROM: Full    Dental  (+) Edentulous Upper   Pulmonary neg pulmonary ROS,  breath sounds clear to auscultation  Pulmonary exam normal       Cardiovascular hypertension, Pt. on medications + CAD, + Peripheral Vascular Disease and +CHF + dysrhythmias Atrial Fibrillation Rhythm:Regular Rate:Normal  ECG: RBBB, anteroseptal infarct.  Chronic diastolic heart failure.  S/P CABG and AAA repair.   Neuro/Psych negative neurological ROS  negative psych ROS   GI/Hepatic Transaminitis. Pancreatitis.   Endo/Other  negative endocrine ROS  Renal/GU Renal diseaseAcute on chronic renal failure Cr 1.5 K 4.1  negative genitourinary   Musculoskeletal negative musculoskeletal ROS (+)   Abdominal   Peds negative pediatric ROS (+)  Hematology negative hematology ROS (+)   Anesthesia Other Findings   Reproductive/Obstetrics negative OB ROS                          Anesthesia Physical Anesthesia Plan  ASA: IV  Anesthesia Plan: General   Post-op Pain Management:    Induction: Intravenous  Airway Management Planned: Oral ETT  Additional Equipment:   Intra-op Plan:   Post-operative Plan: Extubation in OR  Informed Consent: I have reviewed the patients History and Physical, chart, labs and discussed the procedure including the risks, benefits and alternatives for the proposed anesthesia with the patient or authorized representative who has indicated his/her understanding and acceptance.   Dental advisory given  Plan Discussed with: CRNA  Anesthesia Plan Comments: (At increased risk for cardiopulmonary complications due to age and comorbidities.)      Anesthesia Quick Evaluation

## 2013-02-25 NOTE — Anesthesia Postprocedure Evaluation (Signed)
  Anesthesia Post-op Note  Patient: Gregg Hodges  Procedure(s) Performed: Procedure(s) (LRB): ENDOSCOPIC RETROGRADE CHOLANGIOPANCREATOGRAPHY (ERCP) (N/A)  Patient Location: PACU  Anesthesia Type: General  Level of Consciousness: awake and alert   Airway and Oxygen Therapy: Patient Spontanous Breathing  Post-op Pain: mild  Post-op Assessment: Post-op Vital signs reviewed, Patient's Cardiovascular Status Stable, Respiratory Function Stable, Patent Airway and No signs of Nausea or vomiting  Last Vitals:  Filed Vitals:   02/25/13 1615  BP: 158/64  Pulse: 51  Temp: 36.6 C  Resp: 23    Post-op Vital Signs: stable   Complications: No apparent anesthesia complications

## 2013-02-26 ENCOUNTER — Encounter (HOSPITAL_COMMUNITY): Payer: Self-pay | Admitting: Gastroenterology

## 2013-02-26 DIAGNOSIS — K8050 Calculus of bile duct without cholangitis or cholecystitis without obstruction: Principal | ICD-10-CM

## 2013-02-26 DIAGNOSIS — I5032 Chronic diastolic (congestive) heart failure: Secondary | ICD-10-CM

## 2013-02-26 LAB — CBC WITH DIFFERENTIAL/PLATELET
Basophils Absolute: 0 10*3/uL (ref 0.0–0.1)
Basophils Relative: 0 % (ref 0–1)
Lymphocytes Relative: 10 % — ABNORMAL LOW (ref 12–46)
MCHC: 31.5 g/dL (ref 30.0–36.0)
Neutro Abs: 7.6 10*3/uL (ref 1.7–7.7)
Platelets: 167 10*3/uL (ref 150–400)
RDW: 15.8 % — ABNORMAL HIGH (ref 11.5–15.5)
WBC: 9.2 10*3/uL (ref 4.0–10.5)

## 2013-02-26 LAB — COMPREHENSIVE METABOLIC PANEL
ALT: 45 U/L (ref 0–53)
AST: 27 U/L (ref 0–37)
Albumin: 2.4 g/dL — ABNORMAL LOW (ref 3.5–5.2)
CO2: 26 mEq/L (ref 19–32)
Calcium: 8.2 mg/dL — ABNORMAL LOW (ref 8.4–10.5)
Chloride: 106 mEq/L (ref 96–112)
Creatinine, Ser: 1.21 mg/dL (ref 0.50–1.35)
GFR calc non Af Amer: 53 mL/min — ABNORMAL LOW (ref >90)
Sodium: 139 mEq/L (ref 135–145)
Total Bilirubin: 2.2 mg/dL — ABNORMAL HIGH (ref 0.3–1.2)

## 2013-02-26 MED ORDER — HYDROCODONE-ACETAMINOPHEN 5-325 MG PO TABS: 1.0000 | ORAL_TABLET | ORAL | Status: DC | PRN

## 2013-02-26 NOTE — Progress Notes (Signed)
Patient given discharge instructions. Rx for vicodin given. States understanding. Discharged via wheelchair.

## 2013-02-26 NOTE — Discharge Summary (Signed)
[facility]  Physician Discharge Summary   Patient ID: Gregg Hodges MRN: [id number] DOB/AGE: 1926/06/29 79 y.o.  Admit date: 02/23/2013 Discharge date: 02/26/2013  PCP: [facility]  DISCHARGE DIAGNOSES:  Principal Problem:   Pancreatitis, acute Active Problems:   Chronic diastolic heart failure   Acute on chronic renal failure   Transaminitis   Choledocholithiasis   DISCHARGE CONDITION: fair  Diet recommendation: Heart Healthy  Filed Weights   02/23/13 1445 02/24/13 0646  Weight: 95.255 kg (210 lb) 96.798 kg (213 lb 6.4 oz)    INITIAL HISTORY: Patient is 79 yo male with no significant past medical history, presented to [facility] ED with progressively worsening left sided abdominal pain that initially started 1-2 days prior to the admission, sharp and intermittent in nature, 10/10 in severity when present, associated with nausea and poor oral intake, no specific aggravating or alleviating factors. Pt denied similar episodes in the past. Pt denied chest pain or shortness of breath, no urinary concerns, no other systemic symptoms. In ED, pt with persistent pain, lipase > 900, transaminitis, acute renal failure. He was admitted, GI was consulted. He was found to have choledocholithiasis. He underwent ERCP and stone retrieval.   Consultations:  [facility] (Dr. Ewing Schlein)  Procedures:  ERCP and Sphincterotomy and stone removal 5/7  HOSPITAL COURSE:   Acute Gallstone Pancreatitis  Patient was stabilized and started on IVF and symptomatic treatment. He was seen by GI and underwent ERCP and sphincterotomy and stone removal. He feels well. Denies any pain. Tolerating diet. His lab work is showing improvement. He is already s/p cholecystectomy in the past.  Choledocholithiasis  As above   Acute on chronic renal failure  He was noted to have elevated creatinine which was thought to be pre renal in etiology and secondary to poor oral intake. His Lasix was held and he was gently hydrated.  His renal function is back to baseline. He can resume his Lasix.   Transaminitis  Secondary to gallstones. Improving.   Chronic diastolic heart failure  Compensated. Resume Lasix at discharge. He is in positive fluid balance but since he was dehydrated at presentation this is acceptable. Check O2 sats on RA and with ambulation.  Discussed with Dr. Ewing Schlein. Patient is stable for discharge. 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 He lives with his wife. He will be ambulated in hallway and RA sats will be checked prior to discharge.   PERTINENT LABS:  The results of significant diagnostics from this hospitalization (including imaging, microbiology, ancillary and laboratory) are listed below for reference.    Microbiology: No results found for this or any previous visit (from the past 240 hour(s)).   Labs: Basic Metabolic Panel:  Recent Labs Lab 02/23/13 0735 02/24/13 0510 02/25/13 0519 02/26/13 0453  NA 142 143 142 139  K 4.1 4.8 4.1 4.1  CL 105 109 108 106  CO2 26 27 24 26   GLUCOSE 94 90 78 84  BUN 28* 30* 30* 21  CREATININE 1.53* 1.66* 1.50* 1.21  CALCIUM 9.1 8.5 8.5 8.2*   Liver Function Tests:  Recent Labs Lab 02/23/13 0735 02/24/13 0510 02/25/13 0519 02/26/13 0453  AST 123* 89* 51* 27  ALT 94* 85* 66* 45  ALKPHOS 230* 157* 171* 149*  BILITOT 4.0* 6.8* 2.8* 2.2*  PROT 7.2 6.0 6.3 5.8*  ALBUMIN 3.5 2.7* 2.7* 2.4*    Recent Labs Lab 02/23/13 0735 02/24/13 0510  LIPASE 917* 32   No results found for this basename:  AMMONIA,  in the last 168 hours CBC:  Recent Labs Lab 02/23/13 0735 02/24/13 0510 02/25/13 0519 02/26/13 0453  WBC 7.8 12.5* 12.3* 9.2  NEUTROABS  --   --  10.4* 7.6  HGB 13.2 10.8* 10.9* 10.8*  HCT 40.1 34.4* 35.1* 34.3*  MCV 90.3 92.0 91.9 92.0  PLT 137* 134* 164 167   Cardiac Enzymes:  Recent Labs Lab 02/23/13 0735  TROPONINI <0.30    IMAGING STUDIES US Abdomen Complete  02/23/2013  *RADIOLOGY REPORT*  Clinical Data:   Pancreatitis, elevated LFTs, history cholecystectomy, question CBD stone/obstruction; past history CHF, hypertension, coronary artery disease  ULTRASOUND ABDOMEN:  Technique:  Sonography of upper abdominal structures was performed.  Comparison:  02/06/2010  Gallbladder:  Surgically absent  Common bile duct:  Dilated measuring 14 mm diameter, previously 6 mm (pre cholecystectomy).  Liver:  Normal echogenicity without mass or nodularity. Hepatopetal portal venous flow.  Intrahepatic biliary dilatation present.  IVC:  Normal appearance  Pancreas:  Normal appearance.  No pancreatic ductal dilatation identified.  Spleen:  Normal appearance, 9.5 cm length  Right kidney:  14.2 cm length.  Normal cortical thickness echogenicity.  Two cysts identified measuring 3.5 x 3.4 x 3.1 cm at mid kidney and 4.5 x 3.9 x 4.2 cm at inferior pole. No hydronephrosis or shadowing calcification.  Left kidney:  No left renal cortex or defined renal margins identified.  At left renal fossa, a large complex fluid collection is identified. Prior report indicates post left nephrectomy; recommend correlation with history.  The observed fluid collection could represent a complicated postoperative collection or, if the patient has not had a left nephrectomy, longstanding left hydronephrosis with loss of left renal parenchyma.  No solid mass or shadowing calcification.  Aorta:  Normal caliber proximally, obscured at mid and distal portions.  Other:  No ascites  IMPRESSION: Intrahepatic and extrahepatic biliary dilatation with CBD 14 mm diameter. No discrete CBD stone or pancreatic mass is visualized. Recommend MRCP to assess for distal CBD obstruction.  Right renal cysts. Complex fluid collection left renal fossa question complicated postoperative collection if patient has had prior left nephrectomy versus longstanding left hydronephrosis if the patient has not had prior left renal surgery; recommend correlation with patient history.   Original  Report Authenticated By: Ulyses Southward, M.D.    Mr Mrcp  02/24/2013  *RADIOLOGY REPORT*  Clinical Data:  Rule out common bile duct stones.  MRI ABDOMEN WITHOUT CONTRAST (INCLUDING MRCP)  Technique:  Multiplanar multisequence MR imaging of the abdomen was performed both without the administration of intravenous contrast. Heavily T2-weighted images of the biliary and pancreatic ducts were obtained, and three-dimensional MRCP images were rendered by post processing.  Comparison:  02/23/2013  Findings:  Metallic scaffolding from aortic stent graft is identified and causes significant signal void artifact.  There are small bilateral pleural effusions.  Mild intrahepatic bile duct dilatation is noted.  The common bile duct appears increased in caliber measuring 1.2 cm.  At least two filling defects are present within the distal common bile duct and are concerning for choledocholithiasis.  These measure up to 10 mm.  The visualized portions of the pancreas appear normal.  The visualized portion of the spleen is negative.  The adrenal glands are largely obscured by beam hardening artifact. Multiple right renal cysts are identified.  These are of varying signal intensity and are incompletely characterized without IV contrast material.  Several of these cysts exhibit increased T1 signal suggesting hemorrhagic components.  In the expected  location of the left kidney there is a large, complicated cystic mass which is of uncertain significance.  IMPRESSION:  Exam detail is diminished due to signal void artifact from metallic scaffolding from aortic stent graft.  1.  Common bile duct dilatation.  There are at least two stones measuring up to 10 mm within the common bile duct. 2.  Multiple right renal cysts of varying complexity. 3.  Indeterminant complex cystic structure within the expected location of the left kidney.  This could be better assessed with contrast enhanced CT of the upper abdomen.  These results will be called to  the ordering clinician or representative by the Radiologist Assistant, and communication documented in the PACS Dashboard.   Original Report Authenticated By: Signa Kell, M.D.    Mr 3d Recon At Scanner  02/24/2013  *RADIOLOGY REPORT*  Clinical Data:  Rule out common bile duct stones.  MRI ABDOMEN WITHOUT CONTRAST (INCLUDING MRCP)  Technique:  Multiplanar multisequence MR imaging of the abdomen was performed both without the administration of intravenous contrast. Heavily T2-weighted images of the biliary and pancreatic ducts were obtained, and three-dimensional MRCP images were rendered by post processing.  Comparison:  02/23/2013  Findings:  Metallic scaffolding from aortic stent graft is identified and causes significant signal void artifact.  There are small bilateral pleural effusions.  Mild intrahepatic bile duct dilatation is noted.  The common bile duct appears increased in caliber measuring 1.2 cm.  At least two filling defects are present within the distal common bile duct and are concerning for choledocholithiasis.  These measure up to 10 mm.  The visualized portions of the pancreas appear normal.  The visualized portion of the spleen is negative.  The adrenal glands are largely obscured by beam hardening artifact. Multiple right renal cysts are identified.  These are of varying signal intensity and are incompletely characterized without IV contrast material.  Several of these cysts exhibit increased T1 signal suggesting hemorrhagic components.  In the expected location of the left kidney there is a large, complicated cystic mass which is of uncertain significance.  IMPRESSION:  Exam detail is diminished due to signal void artifact from metallic scaffolding from aortic stent graft.  1.  Common bile duct dilatation.  There are at least two stones measuring up to 10 mm within the common bile duct. 2.  Multiple right renal cysts of varying complexity. 3.  Indeterminant complex cystic structure within the  expected location of the left kidney.  This could be better assessed with contrast enhanced CT of the upper abdomen.  These results will be called to the ordering clinician or representative by the Radiologist Assistant, and communication documented in the PACS Dashboard.   Original Report Authenticated By: Signa Kell, M.D.    Dg Ercp Biliary & Pancreatic Ducts  02/25/2013  *RADIOLOGY REPORT*  Clinical Data: Common bile duct stone  ERCP  Technique:  C-arm fluoroscopic images were obtained intraoperatively and submitted for postoperative interpretation. Please see the performing provider's procedural report for the fluoroscopy time utilized.  Comparison: MRCP 02/24/2013.  Ultrasound  abdomen 02/23/2013  Findings: C-arm films document cannulation of the common bile duct with removal of stones following sphincterotomy and balloon sweep of the duct.  The final film displays dilated CBD without visible filling defects.  IMPRESSION: ERCP with sphincterotomy as described.   Original Report Authenticated By: Davonna Belling, M.D.    Dg Abd Acute W/chest  02/23/2013  *RADIOLOGY REPORT*  Clinical Data: Nausea and vomiting.  ACUTE ABDOMEN SERIES (ABDOMEN  2 VIEW & CHEST 1 VIEW)  Comparison: 02/09/2010  Findings: Previous median sternotomy and CABG procedure.  The heart size is mildly enlarged.  There is no pleural effusion or edema. No airspace consolidation identified.  Coarsened interstitial markings are identified and appear similar to previous exam.  Metallic scaffolding from aortic stent graft identified.   Cholecystectomy clips noted within the right upper quadrant of the abdomen.  Gas and stool noted within the colon. There is no dilated loop of small bowel or fluid level. Degenerative disc disease is present within the lumbar spine.  IMPRESSION:  1.  Nonobstructive bowel gas pattern. 2.  No acute cardiopulmonary abnormalities.   Original Report Authenticated By: Signa Kell, M.D.     DISCHARGE  EXAMINATION: Filed Vitals:   02/26/13 0100 02/26/13 0500 02/26/13 0936 02/26/13 1000  BP: 166/72 145/57 140/60 133/63  Pulse: 52 44  94  Temp: 98.6 F (37 C) 97.9 F (36.6 C)  98.3 F (36.8 C)  TempSrc: Oral Oral  Oral  Resp: 18 18  18   Height:      Weight:      SpO2: 96% 97%  98%   General appearance: alert, cooperative, appears stated age and no distress Back: symmetric, no curvature. ROM normal. No CVA tenderness. Resp: clear to auscultation bilaterally Cardio: regular rate and rhythm, S1, S2 normal, no murmur, click, rub or gallop GI: soft, non-tender; bowel sounds normal; no masses,  no organomegaly Neurologic:Alert and oriented x 3. No focal deficits.  DISPOSITION: Home  Discharge Orders   Future Orders Complete By Expires     Diet - low sodium heart healthy  As directed     Discharge instructions  As directed     Comments:      Please follow up with your PCP. Seek attention if you have recurrence of pain or nausea and vomiting or fever/chills.    Increase activity slowly  As directed        ALLERGIES:  Allergies  Allergen Reactions  . Ether     REACTION: Reaction not known  . Isosorbide Mononitrate     REACTION: Blurred vision,and lightheadedness.    Current Discharge Medication List    START taking these medications   Details  HYDROcodone-acetaminophen (NORCO/VICODIN) 5-325 MG per tablet Take 1 tablet by mouth every 4 (four) hours as needed for pain. Qty: 15 tablet, Refills: 0      CONTINUE these medications which have NOT CHANGED   Details  amLODipine (NORVASC) 10 MG tablet Take 10 mg by mouth daily.    aspirin 81 MG chewable tablet Chew 81 mg by mouth daily.    furosemide (LASIX) 40 MG tablet Take 40 mg by mouth daily.    hydrALAZINE (APRESOLINE) 25 MG tablet Take 25 mg by mouth 2 (two) times daily.    lisinopril (PRINIVIL,ZESTRIL) 40 MG tablet Take 40 mg by mouth daily.    meloxicam (MOBIC) 15 MG tablet Take 15 mg by mouth daily.     nitroGLYCERIN (NITROSTAT) 0.4 MG SL tablet Place 0.4 mg under the tongue every 5 (five) minutes as needed for chest pain.    omeprazole (PRILOSEC) 20 MG capsule Take 20 mg by mouth daily.    simvastatin (ZOCOR) 40 MG tablet Take 40 mg by mouth at bedtime.       Follow-up Information   Call [facility] E, MD. (As needed for further problems with abdominal pain, nausea/vomiting)    Contact information:   [address]  [place] [address] [phone]       TOTAL DISCHARGE TIME: 35 mins  [facility]  [facility] Pager [phone]  02/26/2013, 11:06 AM

## 2013-02-26 NOTE — Progress Notes (Signed)
Gregg Hodges 11:18 AM  Subjective: Patient doing well status post ERCP with his bowels and ate breakfast well and no new complaints  Objective: Vital signs stable afebrile no acute distress abdomen is soft nontender labs improved  Assessment: Status post ERCP and CBD stone removal  Plan: Okay with me to go home and followup when necessary particularly if pains continue or recur  Jodene Polyak E

## 2014-11-22 DIAGNOSIS — I219 Acute myocardial infarction, unspecified: Secondary | ICD-10-CM

## 2014-11-22 HISTORY — DX: Acute myocardial infarction, unspecified: I21.9

## 2014-12-02 ENCOUNTER — Inpatient Hospital Stay (HOSPITAL_COMMUNITY)
Admission: EM | Admit: 2014-12-02 | Discharge: 2014-12-05 | DRG: 246 | Disposition: A | Discharge status: Home / Self Care | Payer: Medicare Other | Attending: Cardiology | Admitting: Cardiology

## 2014-12-02 ENCOUNTER — Encounter (HOSPITAL_COMMUNITY): Payer: Self-pay | Admitting: Cardiology

## 2014-12-02 ENCOUNTER — Encounter (HOSPITAL_COMMUNITY)
Admission: EM | Disposition: A | Discharge status: Home / Self Care | Payer: Medicare Other | Source: Home / Self Care | Attending: Cardiology

## 2014-12-02 ENCOUNTER — Emergency Department (HOSPITAL_COMMUNITY): Payer: Medicare Other

## 2014-12-02 ENCOUNTER — Other Ambulatory Visit: Payer: Self-pay

## 2014-12-02 DIAGNOSIS — Z7982 Long term (current) use of aspirin: Secondary | ICD-10-CM | POA: Diagnosis not present

## 2014-12-02 DIAGNOSIS — I249 Acute ischemic heart disease, unspecified: Secondary | ICD-10-CM | POA: Diagnosis present

## 2014-12-02 DIAGNOSIS — I482 Chronic atrial fibrillation: Secondary | ICD-10-CM | POA: Diagnosis present

## 2014-12-02 DIAGNOSIS — E780 Pure hypercholesterolemia: Secondary | ICD-10-CM | POA: Diagnosis present

## 2014-12-02 DIAGNOSIS — I2510 Atherosclerotic heart disease of native coronary artery without angina pectoris: Secondary | ICD-10-CM | POA: Diagnosis present

## 2014-12-02 DIAGNOSIS — Z87891 Personal history of nicotine dependence: Secondary | ICD-10-CM | POA: Diagnosis not present

## 2014-12-02 DIAGNOSIS — Z79899 Other long term (current) drug therapy: Secondary | ICD-10-CM | POA: Diagnosis not present

## 2014-12-02 DIAGNOSIS — I5021 Acute systolic (congestive) heart failure: Secondary | ICD-10-CM | POA: Diagnosis present

## 2014-12-02 DIAGNOSIS — I214 Non-ST elevation (NSTEMI) myocardial infarction: Principal | ICD-10-CM | POA: Diagnosis present

## 2014-12-02 DIAGNOSIS — I452 Bifascicular block: Secondary | ICD-10-CM | POA: Diagnosis present

## 2014-12-02 DIAGNOSIS — I129 Hypertensive chronic kidney disease with stage 1 through stage 4 chronic kidney disease, or unspecified chronic kidney disease: Secondary | ICD-10-CM | POA: Diagnosis present

## 2014-12-02 DIAGNOSIS — D62 Acute posthemorrhagic anemia: Secondary | ICD-10-CM | POA: Diagnosis not present

## 2014-12-02 DIAGNOSIS — N183 Chronic kidney disease, stage 3 (moderate): Secondary | ICD-10-CM | POA: Diagnosis present

## 2014-12-02 DIAGNOSIS — R079 Chest pain, unspecified: Secondary | ICD-10-CM

## 2014-12-02 DIAGNOSIS — Z951 Presence of aortocoronary bypass graft: Secondary | ICD-10-CM

## 2014-12-02 DIAGNOSIS — J449 Chronic obstructive pulmonary disease, unspecified: Secondary | ICD-10-CM | POA: Diagnosis present

## 2014-12-02 HISTORY — PX: LEFT HEART CATHETERIZATION WITH CORONARY/GRAFT ANGIOGRAM: SHX5450

## 2014-12-02 LAB — CBC WITH DIFFERENTIAL/PLATELET
BASOS PCT: 0 % (ref 0–1)
Basophils Absolute: 0 10*3/uL (ref 0.0–0.1)
Basophils Absolute: 0 10*3/uL (ref 0.0–0.1)
Basophils Relative: 0 % (ref 0–1)
EOS ABS: 0.1 10*3/uL (ref 0.0–0.7)
EOS PCT: 1 % (ref 0–5)
EOS PCT: 0 % (ref 0–5)
Eosinophils Absolute: 0 10*3/uL (ref 0.0–0.7)
HCT: 40.2 % (ref 39.0–52.0)
HEMATOCRIT: 37.1 % — AB (ref 39.0–52.0)
HEMOGLOBIN: 12.9 g/dL — AB (ref 13.0–17.0)
Hemoglobin: 12.2 g/dL — ABNORMAL LOW (ref 13.0–17.0)
LYMPHS ABS: 1.1 10*3/uL (ref 0.7–4.0)
LYMPHS PCT: 6 % — AB (ref 12–46)
Lymphocytes Relative: 11 % — ABNORMAL LOW (ref 12–46)
Lymphs Abs: 0.6 10*3/uL — ABNORMAL LOW (ref 0.7–4.0)
MCH: 30.9 pg (ref 26.0–34.0)
MCH: 31.1 pg (ref 26.0–34.0)
MCHC: 32.1 g/dL (ref 30.0–36.0)
MCHC: 32.9 g/dL (ref 30.0–36.0)
MCV: 96.4 fL (ref 78.0–100.0)
MCV: 94.6 fL (ref 78.0–100.0)
MONO ABS: 0.5 10*3/uL (ref 0.1–1.0)
MONO ABS: 0.5 10*3/uL (ref 0.1–1.0)
MONOS PCT: 4 % (ref 3–12)
Monocytes Relative: 5 % (ref 3–12)
NEUTROS ABS: 9.2 10*3/uL — AB (ref 1.7–7.7)
NEUTROS ABS: 8.6 10*3/uL — AB (ref 1.7–7.7)
NEUTROS PCT: 89 % — AB (ref 43–77)
Neutrophils Relative %: 84 % — ABNORMAL HIGH (ref 43–77)
PLATELETS: 259 10*3/uL (ref 150–400)
PLATELETS: 248 10*3/uL (ref 150–400)
RBC: 4.17 MIL/uL — AB (ref 4.22–5.81)
RBC: 3.92 MIL/uL — AB (ref 4.22–5.81)
RDW: 13.1 % (ref 11.5–15.5)
RDW: 13.3 % (ref 11.5–15.5)
WBC: 10.4 10*3/uL (ref 4.0–10.5)
WBC: 10.2 10*3/uL (ref 4.0–10.5)

## 2014-12-02 LAB — COMPREHENSIVE METABOLIC PANEL
ALBUMIN: 2.6 g/dL — AB (ref 3.5–5.2)
ALK PHOS: 85 U/L (ref 39–117)
ALT: 17 U/L (ref 0–53)
ANION GAP: 7 (ref 5–15)
AST: 19 U/L (ref 0–37)
BUN: 22 mg/dL (ref 6–23)
CALCIUM: 7.7 mg/dL — AB (ref 8.4–10.5)
CO2: 23 mmol/L (ref 19–32)
Chloride: 107 mmol/L (ref 96–112)
Creatinine, Ser: 1.4 mg/dL — ABNORMAL HIGH (ref 0.50–1.35)
GFR calc Af Amer: 51 mL/min — ABNORMAL LOW (ref >90)
GFR calc non Af Amer: 44 mL/min — ABNORMAL LOW (ref >90)
GLUCOSE: 111 mg/dL — AB (ref 70–99)
POTASSIUM: 4 mmol/L (ref 3.5–5.1)
Sodium: 137 mmol/L (ref 135–145)
Total Bilirubin: 1.6 mg/dL — ABNORMAL HIGH (ref 0.3–1.2)
Total Protein: 6 g/dL (ref 6.0–8.3)

## 2014-12-02 LAB — BASIC METABOLIC PANEL
ANION GAP: 13 (ref 5–15)
BUN: 24 mg/dL — ABNORMAL HIGH (ref 6–23)
CALCIUM: 8.4 mg/dL (ref 8.4–10.5)
CHLORIDE: 106 mmol/L (ref 96–112)
CO2: 21 mmol/L (ref 19–32)
CREATININE: 1.54 mg/dL — AB (ref 0.50–1.35)
GFR calc non Af Amer: 39 mL/min — ABNORMAL LOW (ref >90)
GFR, EST AFRICAN AMERICAN: 45 mL/min — AB (ref >90)
Glucose, Bld: 101 mg/dL — ABNORMAL HIGH (ref 70–99)
Potassium: 4.4 mmol/L (ref 3.5–5.1)
SODIUM: 140 mmol/L (ref 135–145)

## 2014-12-02 LAB — I-STAT TROPONIN, ED: Troponin i, poc: 1.44 ng/mL (ref 0.00–0.08)

## 2014-12-02 LAB — TROPONIN I
TROPONIN I: 2.48 ng/mL — AB (ref <0.031)
Troponin I: 2.56 ng/mL (ref <0.031)

## 2014-12-02 LAB — HEPARIN LEVEL (UNFRACTIONATED): Heparin Unfractionated: <0.1 IU/mL — ABNORMAL LOW (ref 0.30–0.70)

## 2014-12-02 LAB — PROTIME-INR
INR: 1.31 (ref 0.00–1.49)
Prothrombin Time: 16.4 seconds — ABNORMAL HIGH (ref 11.6–15.2)

## 2014-12-02 LAB — MRSA PCR SCREENING: MRSA BY PCR: NEGATIVE

## 2014-12-02 LAB — POCT ACTIVATED CLOTTING TIME: ACTIVATED CLOTTING TIME: 386 s

## 2014-12-02 LAB — BRAIN NATRIURETIC PEPTIDE: B NATRIURETIC PEPTIDE 5: 811.9 pg/mL — AB (ref 0.0–100.0)

## 2014-12-02 SURGERY — LEFT HEART CATHETERIZATION WITH CORONARY/GRAFT ANGIOGRAM

## 2014-12-02 MED ORDER — LIDOCAINE HCL (PF) 1 % IJ SOLN
INTRAMUSCULAR | Status: AC
  Filled 2014-12-02: qty 30

## 2014-12-02 MED ORDER — CLOPIDOGREL BISULFATE 300 MG PO TABS
ORAL_TABLET | ORAL | Status: AC
  Filled 2014-12-02: qty 1

## 2014-12-02 MED ORDER — HEPARIN BOLUS VIA INFUSION
4000.0000 [IU] | Freq: Once | INTRAVENOUS | Status: AC
  Administered 2014-12-02: 4000 [IU] via INTRAVENOUS
  Filled 2014-12-02: qty 4000

## 2014-12-02 MED ORDER — ASPIRIN EC 81 MG PO TBEC: 81.0000 mg | DELAYED_RELEASE_TABLET | Freq: Every day | ORAL | Status: DC

## 2014-12-02 MED ORDER — DIAZEPAM 2 MG PO TABS
2.0000 mg | ORAL_TABLET | Freq: Three times a day (TID) | ORAL | Status: DC | PRN
  Administered 2014-12-02: 2 mg via ORAL
  Filled 2014-12-02: qty 1

## 2014-12-02 MED ORDER — BIVALIRUDIN 250 MG IV SOLR
INTRAVENOUS | Status: AC
  Filled 2014-12-02: qty 250

## 2014-12-02 MED ORDER — ASPIRIN 81 MG PO CHEW: 81.0000 mg | CHEWABLE_TABLET | ORAL | Status: DC

## 2014-12-02 MED ORDER — ASPIRIN 81 MG PO CHEW
81.0000 mg | CHEWABLE_TABLET | Freq: Every day | ORAL | Status: DC
  Administered 2014-12-04 – 2014-12-05 (×2): 81 mg via ORAL
  Filled 2014-12-02 (×2): qty 1

## 2014-12-02 MED ORDER — HEPARIN SODIUM (PORCINE) 1000 UNIT/ML IJ SOLN
INTRAMUSCULAR | Status: AC
  Filled 2014-12-02: qty 1

## 2014-12-02 MED ORDER — CLOPIDOGREL BISULFATE 75 MG PO TABS
75.0000 mg | ORAL_TABLET | Freq: Every day | ORAL | Status: DC
  Administered 2014-12-03 – 2014-12-05 (×3): 75 mg via ORAL
  Filled 2014-12-02 (×4): qty 1

## 2014-12-02 MED ORDER — FENTANYL CITRATE 0.05 MG/ML IJ SOLN
INTRAMUSCULAR | Status: AC
  Filled 2014-12-02: qty 2

## 2014-12-02 MED ORDER — ONDANSETRON HCL 4 MG/2ML IJ SOLN: 4.0000 mg | Freq: Four times a day (QID) | INTRAMUSCULAR | Status: DC | PRN

## 2014-12-02 MED ORDER — SODIUM BICARBONATE BOLUS VIA INFUSION
INTRAVENOUS | Status: DC
  Filled 2014-12-02: qty 1

## 2014-12-02 MED ORDER — SODIUM CHLORIDE 0.9 % IV SOLN
0.2500 mg/kg/h | INTRAVENOUS | Status: DC
  Filled 2014-12-02 (×2): qty 250

## 2014-12-02 MED ORDER — NITROGLYCERIN 1 MG/10 ML FOR IR/CATH LAB
INTRA_ARTERIAL | Status: AC
  Filled 2014-12-02: qty 10

## 2014-12-02 MED ORDER — SODIUM CHLORIDE 0.9 % IV SOLN
0.2500 mg/kg/h | INTRAVENOUS | Status: AC
  Administered 2014-12-02: 0.25 mg/kg/h via INTRAVENOUS
  Filled 2014-12-02: qty 250

## 2014-12-02 MED ORDER — ACETAMINOPHEN 325 MG PO TABS: 650.0000 mg | ORAL_TABLET | ORAL | Status: DC | PRN

## 2014-12-02 MED ORDER — ATORVASTATIN CALCIUM 80 MG PO TABS
80.0000 mg | ORAL_TABLET | Freq: Every day | ORAL | Status: DC
  Administered 2014-12-03 – 2014-12-04 (×2): 80 mg via ORAL
  Filled 2014-12-02 (×4): qty 1

## 2014-12-02 MED ORDER — ACETAMINOPHEN 325 MG PO TABS
650.0000 mg | ORAL_TABLET | ORAL | Status: DC | PRN
Start: 2014-12-02 — End: 2014-12-05

## 2014-12-02 MED ORDER — SODIUM CHLORIDE 0.9 % IJ SOLN: 3.0000 mL | Freq: Two times a day (BID) | INTRAMUSCULAR | Status: DC

## 2014-12-02 MED ORDER — MIDAZOLAM HCL 2 MG/2ML IJ SOLN
INTRAMUSCULAR | Status: AC
  Filled 2014-12-02: qty 2

## 2014-12-02 MED ORDER — ASPIRIN 81 MG PO CHEW: 324.0000 mg | CHEWABLE_TABLET | ORAL | Status: AC

## 2014-12-02 MED ORDER — NITROGLYCERIN 2 % TD OINT
0.5000 [in_us] | TOPICAL_OINTMENT | Freq: Once | TRANSDERMAL | Status: AC
  Administered 2014-12-02: 0.5 [in_us] via TOPICAL
  Filled 2014-12-02: qty 1

## 2014-12-02 MED ORDER — SODIUM CHLORIDE 0.9 % IV SOLN
1.0000 mL/kg/h | INTRAVENOUS | Status: DC
  Administered 2014-12-02: 1 mL/kg/h via INTRAVENOUS

## 2014-12-02 MED ORDER — SODIUM CHLORIDE 0.9 % IJ SOLN: 3.0000 mL | INTRAMUSCULAR | Status: DC | PRN

## 2014-12-02 MED ORDER — HEPARIN (PORCINE) IN NACL 100-0.45 UNIT/ML-% IJ SOLN
1200.0000 [IU]/h | INTRAMUSCULAR | Status: DC
  Administered 2014-12-02: 1200 [IU]/h via INTRAVENOUS
  Filled 2014-12-02 (×3): qty 250

## 2014-12-02 MED ORDER — CLOPIDOGREL BISULFATE 300 MG PO TABS
300.0000 mg | ORAL_TABLET | ORAL | Status: AC
  Administered 2014-12-02: 300 mg via ORAL
  Filled 2014-12-02: qty 1

## 2014-12-02 MED ORDER — NITROGLYCERIN 0.4 MG SL SUBL: 0.4000 mg | SUBLINGUAL_TABLET | SUBLINGUAL | Status: DC | PRN

## 2014-12-02 MED ORDER — METOPROLOL TARTRATE 12.5 MG HALF TABLET
12.5000 mg | ORAL_TABLET | Freq: Two times a day (BID) | ORAL | Status: DC
  Administered 2014-12-02 – 2014-12-03 (×2): 12.5 mg via ORAL
  Filled 2014-12-02 (×4): qty 1

## 2014-12-02 MED ORDER — SODIUM BICARBONATE 8.4 % IV SOLN
INTRAVENOUS | Status: DC
  Filled 2014-12-02: qty 1000

## 2014-12-02 MED ORDER — NITROGLYCERIN IN D5W 200-5 MCG/ML-% IV SOLN
10.0000 ug/min | INTRAVENOUS | Status: DC
  Administered 2014-12-02: 10 ug/min via INTRAVENOUS
  Filled 2014-12-02: qty 250

## 2014-12-02 MED ORDER — ASPIRIN 81 MG PO CHEW: 81.0000 mg | CHEWABLE_TABLET | Freq: Every day | ORAL | Status: DC

## 2014-12-02 MED ORDER — PANTOPRAZOLE SODIUM 40 MG PO TBEC
40.0000 mg | DELAYED_RELEASE_TABLET | Freq: Every day | ORAL | Status: DC
  Administered 2014-12-02 – 2014-12-05 (×4): 40 mg via ORAL
  Filled 2014-12-02 (×4): qty 1

## 2014-12-02 MED ORDER — NITROGLYCERIN IN D5W 200-5 MCG/ML-% IV SOLN: 5.0000 ug/min | INTRAVENOUS | Status: DC

## 2014-12-02 MED ORDER — SODIUM CHLORIDE 0.9 % IV SOLN: 250.0000 mL | INTRAVENOUS | Status: DC | PRN

## 2014-12-02 MED ORDER — HEPARIN (PORCINE) IN NACL 2-0.9 UNIT/ML-% IJ SOLN
INTRAMUSCULAR | Status: AC
  Filled 2014-12-02: qty 1500

## 2014-12-02 MED ORDER — ASPIRIN 300 MG RE SUPP
300.0000 mg | RECTAL | Status: AC
  Filled 2014-12-02: qty 1

## 2014-12-02 MED ORDER — SODIUM CHLORIDE 0.9 % IV SOLN
INTRAVENOUS | Status: AC
  Administered 2014-12-02 – 2014-12-03 (×2): via INTRAVENOUS

## 2014-12-02 NOTE — ED Notes (Signed)
Troponin I 2.56 per Freda Munro from lab. Lattie Haw, [place] notified.

## 2014-12-02 NOTE — Interval H&P Note (Signed)
Cath Lab Visit (complete for each Cath Lab visit)  Clinical Evaluation Leading to the Procedure:   ACS: Yes.    Non-ACS:    Anginal Classification: CCS IV  Anti-ischemic medical therapy: Maximal Therapy (2 or more classes of medications)  Non-Invasive Test Results: No non-invasive testing performed  Prior CABG: Previous CABG      History and Physical Interval Note:  12/02/2014 4:40 PM  Gregg Hodges  has presented today for surgery, with the diagnosis of cp  The various methods of treatment have been discussed with the patient and family. After consideration of risks, benefits and other options for treatment, the patient has consented to  Procedure(s): LEFT HEART CATHETERIZATION WITH CORONARY/GRAFT ANGIOGRAM (N/A) as a surgical intervention .  The patient's history has been reviewed, patient examined, no change in status, stable for surgery.  I have reviewed the patient's chart and labs.  Questions were answered to the patient's satisfaction.     Clent Demark

## 2014-12-02 NOTE — ED Notes (Addendum)
Pt arrives via [facility] from home. Pt states he began having chest pain 2 days ago with pain in the central chest radiating to the back between the shoulder blades. Pt reports pain feels like pressure that is accompanied by nausea and SOB. Pt. Denies dizziness, lightheadedness and diaphoresis. Pt took 1 nitro tab before EMS arrived and had 1 additional nitro and 324mg  of aspirin en route.

## 2014-12-02 NOTE — ED Notes (Signed)
Admitting at bedside 

## 2014-12-02 NOTE — Progress Notes (Signed)
ANTICOAGULATION CONSULT NOTE - Initial Consult  Pharmacy Consult:  Heparin Indication: chest pain/ACS  Allergies  Allergen Reactions  . Ether     REACTION: Reaction not known  . Isosorbide Mononitrate     REACTION: Blurred vision,and lightheadedness.    Patient Measurements: Height: 5\' 10"  (177.8 cm) Weight: 195 lb (88.451 kg) IBW/kg (Calculated) : 73 Heparin Dosing Weight: 89 kg  Vital Signs: Temp: 99.7 F (37.6 C) (02/11 0929) Temp Source: Oral (02/11 0929) BP: 116/65 mmHg (02/11 1000) Pulse Rate: 51 (02/11 1000)  Labs:  Recent Labs  12/02/14 0941  HGB 12.9*  HCT 40.2  PLT 259  CREATININE 1.54*    Estimated Creatinine Clearance: 37.9 mL/min (by C-G formula based on Cr of 1.54).   Medical History: Past Medical History  Diagnosis Date  . Arthritis   . Cancer     Malignant adnoid carcinoma (roof of mouth)  . CHF (congestive heart failure)   . Coronary artery disease   . Hypertension   . Renal disorder     kidney stones, resulted in blockage of L kidney, later removed due to damage  . Complication of anesthesia     only to ether       Assessment: 5 YOM with history of CHF, Afib, CAD, and HTN presented with 2 day history of chest pain.  Pharmacy consulted to manage IV heparin for ACS.  Baseline labs reviewed.   Goal of Therapy:  Heparin level 0.3-0.7 units/ml Monitor platelets by anticoagulation protocol: Yes    Plan:  - Heparin 4000 units IV bolus x 1, then - Heparin gtt at 1200 units/hr - Check 8 hr HL - Daily HL / CBC   Latesia Norrington D. Mina Marble, PharmD, BCPS Pager:  [phone] 12/02/2014, 10:57 AM

## 2014-12-02 NOTE — ED Provider Notes (Signed)
CSN: [id number]     Arrival date & time 12/02/14  0908 History   First MD Initiated Contact with Patient 12/02/14 [phone]     Chief Complaint  Patient presents with  . Chest Pain     (Consider location/radiation/quality/duration/timing/severity/associated sxs/prior Treatment) Patient is a 81 y.o. male presenting with chest pain. The history is provided by the patient. No language interpreter was used.  Chest Pain Pain location:  Substernal area Pain quality: sharp and tightness   Pain radiates to:  L shoulder, R shoulder and mid back Pain radiates to the back: yes   Pain severity:  Moderate Duration:  3 days Timing:  Intermittent Progression:  Waxing and waning Chronicity:  New Context: movement   Relieved by:  Rest Worsened by:  Exertion Ineffective treatments:  Rest Associated symptoms: back pain, nausea and shortness of breath   Associated symptoms: no abdominal pain, no claudication, no cough, no diaphoresis, no dizziness, no dysphagia, no fatigue, no fever, no headache, no numbness, no syncope, not vomiting and no weakness   Risk factors: coronary artery disease, high cholesterol and hypertension     Past Medical History  Diagnosis Date  . Arthritis   . Cancer     Malignant adnoid carcinoma (roof of mouth)  . CHF (congestive heart failure)   . Coronary artery disease   . Hypertension   . Renal disorder     kidney stones, resulted in blockage of L kidney, later removed due to damage  . Complication of anesthesia     only to ether   Past Surgical History  Procedure Laterality Date  . Cardiac surgery      CABG  . Cholecystectomy    . Esophagogastroduodenoscopy    . Abdominal aortic aneurysm repair    . Ercp N/A 02/25/2013    Procedure: ENDOSCOPIC RETROGRADE CHOLANGIOPANCREATOGRAPHY (ERCP);  Surgeon: Jeryl Columbia, MD;  Location: [facility];  Service: Endoscopy;  Laterality: N/A;   No family history on file. History  Substance Use Topics  . Smoking status: Former Smoker     Types: Cigarettes    Quit date: 02/24/1967  . Smokeless tobacco: Never Used  . Alcohol Use: No    Review of Systems  Constitutional: Negative for fever, diaphoresis, activity change, appetite change and fatigue.  HENT: Negative for congestion, facial swelling, rhinorrhea and trouble swallowing.   Eyes: Negative for photophobia and pain.  Respiratory: Positive for shortness of breath. Negative for cough and chest tightness.   Cardiovascular: Positive for chest pain. Negative for claudication, leg swelling and syncope.  Gastrointestinal: Positive for nausea. Negative for vomiting, abdominal pain, diarrhea and constipation.  Endocrine: Negative for polydipsia and polyuria.  Genitourinary: Negative for dysuria, urgency, decreased urine volume and difficulty urinating.  Musculoskeletal: Positive for back pain. Negative for gait problem.  Skin: Negative for color change, rash and wound.  Allergic/Immunologic: Negative for immunocompromised state.  Neurological: Negative for dizziness, facial asymmetry, speech difficulty, weakness, numbness and headaches.  Psychiatric/Behavioral: Negative for confusion, decreased concentration and agitation.      Allergies  Ether and Isosorbide mononitrate  Home Medications   Prior to Admission medications   Medication Sig Start Date End Date Taking? Authorizing Provider  amLODipine (NORVASC) 10 MG tablet Take 10 mg by mouth daily.    Historical Provider, MD  aspirin 81 MG chewable tablet Chew 81 mg by mouth daily.    Historical Provider, MD  furosemide (LASIX) 40 MG tablet Take 40 mg by mouth daily.    Historical Provider,  MD  hydrALAZINE (APRESOLINE) 25 MG tablet Take 25 mg by mouth 2 (two) times daily.    Historical Provider, MD  HYDROcodone-acetaminophen (NORCO/VICODIN) 5-325 MG per tablet Take 1 tablet by mouth every 4 (four) hours as needed for pain. 02/26/13   Bonnielee Haff, MD  lisinopril (PRINIVIL,ZESTRIL) 40 MG tablet Take 40 mg by mouth  daily.    Historical Provider, MD  meloxicam (MOBIC) 15 MG tablet Take 15 mg by mouth daily.    Historical Provider, MD  nitroGLYCERIN (NITROSTAT) 0.4 MG SL tablet Place 0.4 mg under the tongue every 5 (five) minutes as needed for chest pain.    Historical Provider, MD  omeprazole (PRILOSEC) 20 MG capsule Take 20 mg by mouth daily.    Historical Provider, MD  simvastatin (ZOCOR) 40 MG tablet Take 40 mg by mouth at bedtime.    Historical Provider, MD   BP 128/66 mmHg  Pulse 67  Temp(Src) 99.7 F (37.6 C) (Oral)  Resp 19  Ht 5\' 10"  (1.778 m)  Wt 195 lb (88.451 kg)  BMI 27.98 kg/m2  SpO2 99% Physical Exam  Musculoskeletal: He exhibits edema (1+ BLLE).    ED Course  Procedures (including critical care time) Labs Review Labs Reviewed  CBC WITH DIFFERENTIAL/PLATELET - Abnormal; Notable for the following:    RBC 4.17 (*)    Hemoglobin 12.9 (*)    Neutrophils Relative % 89 (*)    Neutro Abs 9.2 (*)    Lymphocytes Relative 6 (*)    Lymphs Abs 0.6 (*)    All other components within normal limits  BASIC METABOLIC PANEL - Abnormal; Notable for the following:    Glucose, Bld 101 (*)    BUN 24 (*)    Creatinine, Ser 1.54 (*)    GFR calc non Af Amer 39 (*)    GFR calc Af Amer 45 (*)    All other components within normal limits  BRAIN NATRIURETIC PEPTIDE - Abnormal; Notable for the following:    B Natriuretic Peptide 811.9 (*)    All other components within normal limits  TROPONIN I - Abnormal; Notable for the following:    Troponin I 2.56 (*)    All other components within normal limits  I-STAT TROPOININ, ED - Abnormal; Notable for the following:    Troponin i, poc 1.44 (*)    All other components within normal limits  HEPARIN LEVEL (UNFRACTIONATED)    Imaging Review Dg Chest 2 View  12/02/2014   CLINICAL DATA:  Shortness of breath.  Hypertension  EXAM: CHEST  2 VIEW  COMPARISON:  February 05, 2010  FINDINGS: There is underlying emphysematous change with areas of scarring, primarily  in the lower lobe regions. There is no edema or consolidation. Heart is upper normal in size with pulmonary vascularity reflecting the underlying emphysema. No adenopathy. Patient is status post median sternotomy. There is degenerative change in the thoracic spine.  IMPRESSION: Underlying emphysema with areas of scarring. No edema or consolidation.   Electronically Signed   By: Lowella Grip III M.D.   On: 12/02/2014 10:29     EKG Interpretation   Date/Time:  Thursday December 02 2014 09:25:50 EST Ventricular Rate:  67 PR Interval:    QRS Duration: 138 QT Interval:  458 QTC Calculation: 483 R Axis:   -67 Text Interpretation:  Atrial fibrillation RBBB and LAFB Nonspecific T  abnormalities, lateral leads No significant change since last tracing  Confirmed by Haileigh Pitz  MD, Federica Allport ([id number]) on 12/02/2014 9:33:41 AM  CRITICAL CARE Performed by: Ernestina Patches, E Total critical care time: 35 Critical care time was exclusive of separately billable procedures and treating other patients. Critical care was necessary to treat or prevent imminent or life-threatening deterioration. Critical care was time spent personally by me on the following activities: development of treatment plan with patient and/or surrogate as well as nursing, discussions with consultants, evaluation of patient's response to treatment, examination of patient, obtaining history from patient or surrogate, ordering and performing treatments and interventions, ordering and review of laboratory studies, ordering and review of radiographic studies, pulse oximetry and re-evaluation of patient's condition.   MDM   Final diagnoses:  Chest pain  NSTEMI (non-ST elevated myocardial infarction)    The patient is an 81 year old male with past medical history significant for coronary artery disease and atrial fibrillation who presents with 2-3 days of exertional central chest pain with radiation to the back, associated shortness of  breath and today with associated nausea.  Patient took 1 sublingual nitroglycerin tract tablet before EMS arrival this morning.  EMS provided him with additional sublingual nitroglycerin and 324 of aspirin in route.  He's currently has no chest pain that is having some back pain currently.  He reports being under a lot of stress recently as his wife died approximately 5 weeks ago.  Istat trop elevated, lab trop sent. Dr. Terrence Dupont w/ cardiology consulted for [facility], will start heparin and nitro gtt.   Trop-I elevated 2.56.   Ernestina Patches, MD 12/02/14 [phone]

## 2014-12-02 NOTE — H&P (Signed)
Gregg Hodges is an 81 y.o. male.   Chief Complaint: Recurrent chest pain associated with shortness of breath HPI: Patient is 81 year old male with past medical history significant for coronary artery disease status post CABG in 1983 approximately 33 years ago, hypertension, chronic atrial fibrillation, COPD, degenerative joint disease, history of pancreatitis in the past, history of abdominal aortic aneurysm status post repair in the past, remote tobacco abuse, came to the ER by EMS complaining of recurrent retrosternal chest pain described as pressure grade 5/10 radiating to her shoulder and back associated with progressive increasing shortness of breath and nausea for last few days did not seek any medical attention as chest pain and breathing got worse so decided to call EMS patient took sublingual nitroglycerin and aspirin with relief of chest pain. EKG done in the ED showed A. fib with controlled ventricular response right bundle branch block and left anterior fascicular block  with secondary ST-T wave changes, and was noted to have elevated troponin I of 2.56. Patient states he has been followed at [facility] in [facility] has seen [facility] in the past. Patient and family requesting for me to take care of him as he has not seen [facility] for many years. Patient denies any palpitation lightheadedness or syncope. Denies PND orthopnea but complains of occasional leg swelling. States used to be on Coumadin but has stopped many years ago and do not want to take oral anticoagulants.  Past Medical History  Diagnosis Date  . Arthritis   . Cancer     Malignant adnoid carcinoma (roof of mouth)  . CHF (congestive heart failure)   . Coronary artery disease   . Hypertension   . Renal disorder     kidney stones, resulted in blockage of L kidney, later removed due to damage  . Complication of anesthesia     only to ether    Past Surgical History  Procedure Laterality Date  . Cardiac surgery      CABG  . Cholecystectomy    . Esophagogastroduodenoscopy    . Abdominal aortic aneurysm repair    . Ercp N/A 02/25/2013    Procedure: ENDOSCOPIC RETROGRADE CHOLANGIOPANCREATOGRAPHY (ERCP);  Surgeon: Jeryl Columbia, MD;  Location: [facility];  Service: Endoscopy;  Laterality: N/A;    No family history on file. Social History:  reports that he quit smoking about 47 years ago. His smoking use included Cigarettes. He has never used smokeless tobacco. He reports that he does not drink alcohol or use illicit drugs.  Allergies:  Allergies  Allergen Reactions  . Ether     REACTION: Reaction not known  . Isosorbide Mononitrate     REACTION: Blurred vision,and lightheadedness.     (Not in a hospital admission)  Results for orders placed or performed during the hospital encounter of 12/02/14 (from the past 48 hour(s))  CBC with Differential/Platelet     Status: Abnormal   Collection Time: 12/02/14  9:41 AM  Result Value Ref Range   WBC 10.4 4.0 - 10.5 K/uL   RBC 4.17 (L) 4.22 - 5.81 MIL/uL   Hemoglobin 12.9 (L) 13.0 - 17.0 g/dL   HCT 40.2 39.0 - 52.0 %   MCV 96.4 78.0 - 100.0 fL   MCH 30.9 26.0 - 34.0 pg   MCHC 32.1 30.0 - 36.0 g/dL   RDW 13.1 11.5 - 15.5 %   Platelets 259 150 - 400 K/uL   Neutrophils Relative % 89 (H) 43 - 77 %   Neutro  Abs 9.2 (H) 1.7 - 7.7 K/uL   Lymphocytes Relative 6 (L) 12 - 46 %   Lymphs Abs 0.6 (L) 0.7 - 4.0 K/uL   Monocytes Relative 4 3 - 12 %   Monocytes Absolute 0.5 0.1 - 1.0 K/uL   Eosinophils Relative 1 0 - 5 %   Eosinophils Absolute 0.1 0.0 - 0.7 K/uL   Basophils Relative 0 0 - 1 %   Basophils Absolute 0.0 0.0 - 0.1 K/uL  Basic metabolic panel     Status: Abnormal   Collection Time: 12/02/14  9:41 AM  Result Value Ref Range   Sodium 140 135 - 145 mmol/L   Potassium 4.4 3.5 - 5.1 mmol/L   Chloride 106 96 - 112 mmol/L   CO2 21 19 - 32 mmol/L   Glucose, Bld 101 (H) 70 - 99 mg/dL   BUN 24 (H) 6 - 23 mg/dL   Creatinine, Ser 1.54 (H) 0.50 - 1.35 mg/dL    Calcium 8.4 8.4 - 10.5 mg/dL   GFR calc non Af Amer 39 (L) >90 mL/min   GFR calc Af Amer 45 (L) >90 mL/min    Comment: (NOTE) The eGFR has been calculated using the CKD EPI equation. This calculation has not been validated in all clinical situations. eGFR's persistently <90 mL/min signify possible Chronic Kidney Disease.    Anion gap 13 5 - 15  Brain natriuretic peptide     Status: Abnormal   Collection Time: 12/02/14  9:41 AM  Result Value Ref Range   B Natriuretic Peptide 811.9 (H) 0.0 - 100.0 pg/mL  Troponin I     Status: Abnormal   Collection Time: 12/02/14  9:41 AM  Result Value Ref Range   Troponin I 2.56 (HH) <0.031 ng/mL    Comment:        POSSIBLE MYOCARDIAL ISCHEMIA. SERIAL TESTING RECOMMENDED. REPEATED TO VERIFY CRITICAL RESULT CALLED TO, READ BACK BY AND VERIFIED WITH: N.STEVENS,RN [id number] 12/02/14 CLARK,S   I-stat troponin, ED     Status: Abnormal   Collection Time: 12/02/14  9:47 AM  Result Value Ref Range   Troponin i, poc 1.44 (HH) 0.00 - 0.08 ng/mL   Comment NOTIFIED PHYSICIAN    Comment 3            Comment: Due to the release kinetics of cTnI, a negative result within the first hours of the onset of symptoms does not rule out myocardial infarction with certainty. If myocardial infarction is still suspected, repeat the test at appropriate intervals.    Dg Chest 2 View  12/02/2014   CLINICAL DATA:  Shortness of breath.  Hypertension  EXAM: CHEST  2 VIEW  COMPARISON:  February 05, 2010  FINDINGS: There is underlying emphysematous change with areas of scarring, primarily in the lower lobe regions. There is no edema or consolidation. Heart is upper normal in size with pulmonary vascularity reflecting the underlying emphysema. No adenopathy. Patient is status post median sternotomy. There is degenerative change in the thoracic spine.  IMPRESSION: Underlying emphysema with areas of scarring. No edema or consolidation.   Electronically Signed   By: Lowella Grip III  M.D.   On: 12/02/2014 10:29    Review of Systems  Constitutional: Negative for fever and chills.  Eyes: Negative for double vision and photophobia.  Respiratory: Positive for hemoptysis and shortness of breath. Negative for sputum production.   Cardiovascular: Positive for chest pain and leg swelling. Negative for palpitations and orthopnea.  Gastrointestinal: Positive for nausea. Negative  for vomiting, abdominal pain and diarrhea.  Genitourinary: Negative for dysuria.  Neurological: Negative for dizziness.    Blood pressure 120/72, pulse 76, temperature 99.7 F (37.6 C), temperature source Oral, resp. rate 22, height 5' 10"  (1.778 m), weight 88.451 kg (195 lb), SpO2 93 %. Physical Exam  Constitutional: He is oriented to person, place, and time.  HENT:  Head: Normocephalic and atraumatic.  Eyes: Conjunctivae are normal. Left eye exhibits no discharge. No scleral icterus.  Neck: Normal range of motion. Neck supple. No JVD present. No tracheal deviation present. No thyromegaly present.  Cardiovascular:  Irregularly irregular S1 and S2 soft there is soft systolic murmur  Respiratory:  Decreased breath sound at bases with faint rales noted  GI: Soft. Bowel sounds are normal. He exhibits no distension. There is no tenderness.  Musculoskeletal: He exhibits no edema or tenderness.  Neurological: He is alert and oriented to person, place, and time.     Assessment/Plan Acute coronary syndrome Coronary artery disease status post CABG in 1983 Hypertension Mild acute congestive heart failure secondary to preserved LV systolic function Hypercholesteremia Chronic atrial fibrillation. COPD Remote tobacco abuse Chronic kidney disease stage III History of renal stones Degenerative joint disease History of pancreatitis Plan As per orders Discussed with patient and family at length regarding various options of treatment i.e. medical versus left cardiac cath possible PTCA stenting its risk  and benefits i.e. death MI stroke need for emergency CABG local vascular complications worsening renal function etc. and consents for PCI  [facility] 12/02/2014, 12:23 PM

## 2014-12-02 NOTE — CV Procedure (Signed)
Left cardiac cath/PTCA stenting report dictated on 12/02/1998 next E dictation number is [id number]

## 2014-12-02 NOTE — Care Management Note (Signed)
    Page 1 of 1   12/02/2014     2:47:04 PM CARE MANAGEMENT NOTE 12/02/2014  Patient:  Gregg Hodges, Gregg Hodges   Account Number:  [id number]  Date Initiated:  12/02/2014  Documentation initiated by:  Elissa Hefty  Subjective/Objective Assessment:   adm w mi     Action/Plan:   from home, goes to [facility] for follow up   Anticipated DC Date:     Anticipated DC Plan:  [place]         Choice offered to / List presented to:             Status of service:   Medicare Important Message given?   (If response is "NO", the following Medicare IM given date fields will be blank) Date Medicare IM given:   Medicare IM given by:   Date Additional Medicare IM given:   Additional Medicare IM given by:    Discharge Disposition:    Per UR Regulation:  Reviewed for med. necessity/level of care/duration of stay  If discussed at [place] of Stay Meetings, dates discussed:    Comments:

## 2014-12-03 ENCOUNTER — Encounter (HOSPITAL_COMMUNITY): Payer: Self-pay | Admitting: *Deleted

## 2014-12-03 DIAGNOSIS — I214 Non-ST elevation (NSTEMI) myocardial infarction: Secondary | ICD-10-CM

## 2014-12-03 LAB — BASIC METABOLIC PANEL
Anion gap: 7 (ref 5–15)
BUN: 21 mg/dL (ref 6–23)
CO2: 24 mmol/L (ref 19–32)
Calcium: 7.9 mg/dL — ABNORMAL LOW (ref 8.4–10.5)
Chloride: 109 mmol/L (ref 96–112)
Creatinine, Ser: 1.34 mg/dL (ref 0.50–1.35)
GFR, EST AFRICAN AMERICAN: 53 mL/min — AB (ref >90)
GFR, EST NON AFRICAN AMERICAN: 46 mL/min — AB (ref >90)
Glucose, Bld: 99 mg/dL (ref 70–99)
POTASSIUM: 4.1 mmol/L (ref 3.5–5.1)
SODIUM: 140 mmol/L (ref 135–145)

## 2014-12-03 LAB — LIPID PANEL
Cholesterol: 113 mg/dL (ref 0–200)
HDL: 23 mg/dL — ABNORMAL LOW (ref >39)
LDL CALC: 75 mg/dL (ref 0–99)
Total CHOL/HDL Ratio: 4.9 RATIO
Triglycerides: 75 mg/dL (ref <150)
VLDL: 15 mg/dL (ref 0–40)

## 2014-12-03 LAB — CBC
HCT: 34.5 % — ABNORMAL LOW (ref 39.0–52.0)
Hemoglobin: 11.2 g/dL — ABNORMAL LOW (ref 13.0–17.0)
MCH: 30.4 pg (ref 26.0–34.0)
MCHC: 32.5 g/dL (ref 30.0–36.0)
MCV: 93.5 fL (ref 78.0–100.0)
Platelets: 245 10*3/uL (ref 150–400)
RBC: 3.69 MIL/uL — ABNORMAL LOW (ref 4.22–5.81)
RDW: 13.1 % (ref 11.5–15.5)
WBC: 8.2 10*3/uL (ref 4.0–10.5)

## 2014-12-03 LAB — TROPONIN I
TROPONIN I: 3.23 ng/mL — AB (ref <0.031)
Troponin I: 3 ng/mL (ref <0.031)

## 2014-12-03 MED ORDER — ATROPINE SULFATE 0.1 MG/ML IJ SOLN
INTRAMUSCULAR | Status: AC
  Filled 2014-12-03: qty 10

## 2014-12-03 MED ORDER — FERROUS SULFATE 325 (65 FE) MG PO TABS
325.0000 mg | ORAL_TABLET | Freq: Three times a day (TID) | ORAL | Status: DC
  Administered 2014-12-03 – 2014-12-05 (×6): 325 mg via ORAL
  Filled 2014-12-03 (×8): qty 1

## 2014-12-03 MED ORDER — LISINOPRIL 10 MG PO TABS
10.0000 mg | ORAL_TABLET | Freq: Every day | ORAL | Status: DC
  Administered 2014-12-03 – 2014-12-05 (×3): 10 mg via ORAL
  Filled 2014-12-03 (×3): qty 1

## 2014-12-03 MED ORDER — METOPROLOL SUCCINATE ER 25 MG PO TB24
12.5000 mg | ORAL_TABLET | Freq: Every day | ORAL | Status: DC
  Administered 2014-12-04 – 2014-12-05 (×2): 12.5 mg via ORAL
  Filled 2014-12-03 (×2): qty 1

## 2014-12-03 MED ORDER — SENNOSIDES-DOCUSATE SODIUM 8.6-50 MG PO TABS: 1.0000 | ORAL_TABLET | Freq: Two times a day (BID) | ORAL | Status: DC | PRN

## 2014-12-03 MED ORDER — ZOLPIDEM TARTRATE 5 MG PO TABS
5.0000 mg | ORAL_TABLET | Freq: Every evening | ORAL | Status: DC | PRN
  Administered 2014-12-03: 5 mg via ORAL
  Filled 2014-12-03: qty 1

## 2014-12-03 MED FILL — Sodium Chloride IV Soln 0.9%: INTRAVENOUS | Qty: 50 | Status: AC

## 2014-12-03 NOTE — Progress Notes (Signed)
Pt has had several pauses around 2 seconds long.  Pt asymptomatic, VSS.  Dr. Terrence Dupont notified and orders received. Will continue to monitor.

## 2014-12-03 NOTE — Progress Notes (Signed)
Left femoral sheath pulled. Pressure held for 25 minutes. Vital signs stable throughout. Site level 0. Will continue to monitor.

## 2014-12-03 NOTE — Progress Notes (Signed)
Subjective:  Patient denies any chest pain or shortness of breath. Denies dizziness lightheadedness. Remains in A. fib with slow ventricular response with occasional pauses.  Objective:  Vital Signs in the last 24 hours: Temp:  [97.8 F (36.6 C)-99 F (37.2 C)] 97.8 F (36.6 C) (02/12 0800) Pulse Rate:  [37-147] 54 (02/12 1100) Resp:  [11-34] 17 (02/12 1100) BP: (107-156)/(54-85) 107/54 mmHg (02/12 1100) SpO2:  [90 %-100 %] 94 % (02/12 1100) Weight:  [88.1 kg (194 lb 3.6 oz)] 88.1 kg (194 lb 3.6 oz) (02/11 2200)  Intake/Output from previous day: 02/11 0701 - 02/12 0700 In: 1363.2 [I.V.:1363.2] Out: 1150 [Urine:1150] Intake/Output from this shift: Total I/O In: 570 [P.O.:120; I.V.:450] Out: -   Physical Exam: Neck: no adenopathy, no carotid bruit, no JVD and supple, symmetrical, trachea midline Lungs: clear to auscultation bilaterally Heart: irregularly irregular rhythm, S1, S2 normal and Soft systolic murmur noted Abdomen: soft, non-tender; bowel sounds normal; no masses,  no organomegaly Extremities: extremities normal, atraumatic, no cyanosis or edema and Both groin stable  Lab Results:  Recent Labs  12/02/14 1525 12/03/14 0240  WBC 10.2 8.2  HGB 12.2* 11.2*  PLT 248 245    Recent Labs  12/02/14 2116 12/03/14 0240  NA 137 140  K 4.0 4.1  CL 107 109  CO2 23 24  GLUCOSE 111* 99  BUN 22 21  CREATININE 1.40* 1.34    Recent Labs  12/02/14 2116 12/03/14 0240  TROPONINI 2.48* 3.23*   Hepatic Function Panel  Recent Labs  12/02/14 2116  PROT 6.0  ALBUMIN 2.6*  AST 19  ALT 17  ALKPHOS 85  BILITOT 1.6*    Recent Labs  12/03/14 0240  CHOL 113   No results for input(s): PROTIME in the last 72 hours.  Imaging: Imaging results have been reviewed and Dg Chest 2 View  12/02/2014   CLINICAL DATA:  Shortness of breath.  Hypertension  EXAM: CHEST  2 VIEW  COMPARISON:  February 05, 2010  FINDINGS: There is underlying emphysematous change with areas of  scarring, primarily in the lower lobe regions. There is no edema or consolidation. Heart is upper normal in size with pulmonary vascularity reflecting the underlying emphysema. No adenopathy. Patient is status post median sternotomy. There is degenerative change in the thoracic spine.  IMPRESSION: Underlying emphysema with areas of scarring. No edema or consolidation.   Electronically Signed   By: Lowella Grip III M.D.   On: 12/02/2014 10:29    Cardiac Studies:  Assessment/Plan:  Acute coronary syndrome status post left cardiac cath/PTCA stenting to saphenous vein graft to LAD and protected left main and left circumflex with excellent angiographic results Coronary artery disease status post CABG in 1983 Hypertension Mild acute congestive heart failure secondary to preserved LV systolic function Hypercholesteremia Chronic atrial fibrillation. COPD Remote tobacco abuse Chronic kidney disease stage III improved History of renal stones Degenerative joint disease History of pancreatitis Acute anemia secondary to hydration and blood loss during the procedure Plan Check labs in a.m. Add Feosol Social service for discharge planning PT consult Reduce metoprolol dose as per orders Restart Ace inhibitors low dose Check 2-D echo  Dr. Doylene Canard on-call for me for weekend  LOS: 1 day    Gregg Hodges 12/03/2014, 12:51 PM

## 2014-12-03 NOTE — Progress Notes (Signed)
CARDIAC REHAB PHASE I   PRE:  Rate/Rhythm: 56 afib    BP: sitting 107/54, standing 157/75    SaO2: 90 RA  MODE:  Ambulation: 150 ft   POST:  Rate/Rhythm: 70 afib    BP: sitting 109/64     SaO2: 97 RA  Pt weak standing (recliner also low for him). Unsteady walking to BR. Used RW to walk, which improved his steadiness. Tired/weak toward end of walk. Also SOB. SaO2 ok. Return to recliner. Gave pt stent card but he declined MI book. Sts he knows all of that. Concerned about pt living by himself. Suggest PT c/s to help evaluate. Pt sts he is not interested in going to SNF. Not sure if family can stay with him. [id number]  Gregg Hodges [place] CES, ACSM 12/03/2014 11:58 AM

## 2014-12-03 NOTE — Cardiovascular Report (Signed)
NAMELANDRUM, CARBONELL                ACCOUNT NO.:  [id number]  MEDICAL RECORD NO.:  [id number]  LOCATION:  2H11C                        FACILITY:  [facility]  PHYSICIAN:  Brynna Dobos N. Terrence Dupont, M.D. DATE OF BIRTH:  08/24/1926  DATE OF PROCEDURE:  12/02/2014 DATE OF DISCHARGE:                           CARDIAC CATHETERIZATION   PROCEDURE: 1. Left cardiac cath with selective left and right coronary     angiography, visualization of saphenous vein graft to LAD via left     groin using Judkins technique. 2. Successful PTCA to proximal left circumflex and protected ostial     left main using 2.5 x 12 mm long Emerge balloon followed by a 3.0 x     15 mm long [place] Emerge balloon and followed by 3.5 x 20 mm long [place]     Emerge balloon for predilatation. 3. Successful deployment of 3.5 x 38 mm long Xience Alpine drug-     eluting stent in proximal circ ending into distal and mid left     main.  Successful postdilatation of this stent using 3.5 x 15 mm     long [place] Emerge balloon followed by a 4.0 x 8 mm long  Emerge     balloon in left main going up to 18 atmospheric pressure. 4. Successful PTCA to saphenous vein graft to LAD using 2.0 x 15 mm     long balloon for predilatation followed by a 3.5 x 15 mm long     Emerge balloon and then a successful deployment of 4.0 x 15 mm long     Xience Alpine drug-eluting stent in saphenous vein graft to LAD in     the proximal portion going up to 14 atmospheric pressure and then     followed by full expansion of the stent using same balloon going up     to 18 atmospheric pressure.  INDICATION FOR THE PROCEDURE:  Mr. Azucena is an 81 year old male with past medical history significant for coronary artery disease, status post CABG in 1983, approximately 76 years ago, old records not available; hypertension; chronic atrial fibrillation; COPD; degenerative joint disease; history of pancreatitis in the past; history of abdominal aortic aneurysm, status post repair in the  past; remote tobacco abuse. He came to the ER by EMS complaining of recurrent retrosternal chest pain described as pressure, grade 5/10, radiating to his shoulder and back associated with progressive increasing shortness of breath and nausea for last few days.  The patient did not seek any medical attention, but as the chest pain and breathing got worse, so decided to call EMS.  Patient took sublingual nitro and aspirin with relief of chest pain.  EKG done in the ED showed AFib with controlled ventricular response, right bundle-branch block and with secondary ST-T wave changes, and left anterior fascicular block and was noted to have elevated troponin I of 2.56.  The patient states he has been followed at [place] in [place] in the past.  He is being followed at [facility].  Due to typical anginal chest pain, elevated cardiac enzymes, multiple risk factors, I discussed with patient and family regarding left cardiac cath with selective left and right coronary angiography, PTCA  stenting, its risks and benefits, i.e., death, MI, stroke, need for emergency CABG, local vascular complications, etc., and consented for PCI.  DESCRIPTION OF PROCEDURE:  After obtaining the informed consent, patient was brought to the cath lab and was placed on fluoroscopy table.  Right groin was prepped and draped in usual fashion, 1% Xylocaine was used for local anesthesia in the right groin.  With the help of thin wall needle, a 6-French arterial sheath was placed.  The sheath was aspirated and flushed.  Next, attempted to advance the wire via the right femoral approach, but wire could not be advanced as the patient's iliac stent was bend and pointing downward.  Next, a 6-French arterial sheath was placed in the left femoral artery without difficulty.  This sheath was exchanged to 35 mm long sheath which was exchanged back again to short sheath at the end of the procedure.  Next, a 6-French left Judkins catheter was  advanced over the wire under fluoroscopic guidance up to the ascending aorta.  Wire was pulled out.  The catheter was aspirated and connected to the Manifold.  Catheter was further advanced and engaged into left coronary ostium.  Multiple views of the left system were taken.  Next, catheter was disengaged and was pulled out over the wire and was replaced with 6-French right Judkins catheter, which was advanced over the wire under fluoroscopic guidance up to the ascending aorta.  Wire was pulled out.  The catheter was aspirated and connected to the Manifold.  Catheter was further advanced and engaged into right coronary ostium.  A single view of right coronary artery was obtained. Next, catheter was disengaged and was engaged into saphenous vein graft to LAD and multiple views of this graft were taken.  Next, the catheter was pulled out over the wire.  Sheaths were aspirated and flushed.  FINDINGS:  Left main showed 85% to 90% distal stenosis.  LAD was 100% occluded at the ostium.  Left circumflex has ostial 85% to 90% stenosis and 85% to 90% proximal stenosis, and 30% to 40% mid stenosis.  OM1 was moderate sized, which was patent.  OM2 has 75% to 80% ostial stenosis. Vessel is very small.  RCA was 100% occluded at the ostium.  Saphenous vein graft to LAD has 30% to 40% ostial and 90% to 95% proximal stenosis, and 85% to 90% distal stenosis.  INTERVENTIONAL PROCEDURE:  Successful PTCA to proximal left circumflex and ostial protected left main was done using 2.5 x 12 mm long Emerge balloon followed by 3.0 x 15 mm long [place] Emerge balloon and then 3.5 x 20 mm long [place] Emerge balloon for predilatation and then 3.5 x 38 mm long Xience Alpine drug-eluting stent was deployed at 11 atmospheric pressure in proximal left circumflex and ostial and mid left main.  This stent was post dilated using 3.5 x 15 mm long [place] Emerge balloon going up to 18 atmospheric pressure and followed by a 4.0 x 8 mm long  [place] Emerge balloon and protected left main going up to 18 atmospheric pressure.  Lesions dilated from 85% to 90% to 0% residual with excellent TIMI grade 3 distal flow without evidence of dissection or distal embolization.  Successful PTCA to proximal saphenous vein graft to LAD was done using 2.0 x 15 mm long Emerge balloon for predilatation and then 4.0 x 15 mm long Xience Alpine drug-eluting and followed by 3.5 x 15 mm long Emerge balloon for predilatation and then 4.0 x 15 mm long  Xience Alpine drug- eluting stent was deployed in proximal saphenous vein graft to LAD at 14 atmospheric pressure.  This stent was fully dilated using same balloon going up to 18 atmospheric pressure.  Lesion dilated from 90% to 95% to 0% residual with excellent TIMI grade 3 distal flow without evidence of dissection or distal embolization.  Attempted to advance the wires crossing the distal lesion, but there was no guide support despite using multiple guiding catheters.  The patient tolerated the procedure well. There were no complications.  The patient had very complex anatomy in the groin and in the coronary arteries requiring approximately 70-minute of fluoro time.  Overall, the patient tolerated the procedure well.  The patient was transferred to CCU in stable condition.     [name]. Terrence Dupont, M.D.     MNH/MEDQ  D:  12/02/2014  T:  12/03/2014  Job:  [id number]

## 2014-12-03 NOTE — Progress Notes (Signed)
2D Echocardiogram Complete.  12/03/2014   Catlynn Grondahl, RDCS  

## 2014-12-03 NOTE — Progress Notes (Signed)
After sitting up in chair and walking to bathroom, pt c/o shortness of breath and feeling "smothered."  Assisted pt back to bed. O2 sats in mid 90s. Lung sounds clear.  O2 applied at 2L.  12 lead ECG obtained with no significant changes from previous ECG. VSS. Temperature turned down in room as pt c/o also of being hot.  Continued to monitor pt closely for another 30-45 minutes and pt reported relief from symptoms.  Will continue to monitor.

## 2014-12-03 NOTE — Progress Notes (Signed)
Right femoral sheath pulled. Pressure held for 20 minutes. Vital signs stable throughout. Site level 0. Will continue to monitor.

## 2014-12-04 LAB — BASIC METABOLIC PANEL
ANION GAP: 8 (ref 5–15)
BUN: 28 mg/dL — ABNORMAL HIGH (ref 6–23)
CHLORIDE: 108 mmol/L (ref 96–112)
CO2: 25 mmol/L (ref 19–32)
CREATININE: 1.45 mg/dL — AB (ref 0.50–1.35)
Calcium: 7.9 mg/dL — ABNORMAL LOW (ref 8.4–10.5)
GFR calc Af Amer: 48 mL/min — ABNORMAL LOW (ref >90)
GFR calc non Af Amer: 42 mL/min — ABNORMAL LOW (ref >90)
GLUCOSE: 110 mg/dL — AB (ref 70–99)
Potassium: 4 mmol/L (ref 3.5–5.1)
Sodium: 141 mmol/L (ref 135–145)

## 2014-12-04 LAB — CBC
HCT: 34.4 % — ABNORMAL LOW (ref 39.0–52.0)
Hemoglobin: 11.2 g/dL — ABNORMAL LOW (ref 13.0–17.0)
MCH: 31.1 pg (ref 26.0–34.0)
MCHC: 32.6 g/dL (ref 30.0–36.0)
MCV: 95.6 fL (ref 78.0–100.0)
Platelets: 241 10*3/uL (ref 150–400)
RBC: 3.6 MIL/uL — AB (ref 4.22–5.81)
RDW: 13.2 % (ref 11.5–15.5)
WBC: 8.8 10*3/uL (ref 4.0–10.5)

## 2014-12-04 LAB — TROPONIN I: Troponin I: 2.34 ng/mL (ref <0.031)

## 2014-12-04 NOTE — Evaluation (Signed)
Physical Therapy Evaluation Patient Details Name: Gregg Hodges MRN: [id number] DOB: May 03, 1926 Today's Date: 12/04/2014   History of Present Illness  Patient is 81 year old male with past medical history significant for coronary artery disease status post CABG in 1983 approximately 33 years ago, hypertension, chronic atrial fibrillation, COPD, degenerative joint disease, history of pancreatitis in the past, history of abdominal aortic aneurysm status post repair in the past, remote tobacco abuse, came to the ER by EMS complaining of recurrent retrosternal chest pain. Pt with ACS s/p cath   Clinical Impression  Pt very pleasant and witnessed transfers supine to EOB while outside of room. Pt with decreased activity tolerance from baseline and states dgtr can assist. Pt unable to don socks, shoes or AFO without assist today and will need assist for ADLs at D/C. Pt will benefit from acute therapy to maximize mobility, function, gait and independence to decrease burden of care.     Follow Up Recommendations Home health PT;Supervision - Intermittent    Equipment Recommendations  None recommended by PT    Recommendations for Other Services       Precautions / Restrictions Precautions Precautions: Fall Precaution Comments: Right AFO Restrictions Weight Bearing Restrictions: No      Mobility  Bed Mobility Overal bed mobility: Modified Independent                Transfers Overall transfer level: Needs assistance   Transfers: Sit to/from Stand Sit to Stand: Supervision         General transfer comment: cues for safety and hand placement  Ambulation/Gait Ambulation/Gait assistance: Supervision Ambulation Distance (Feet): 300 Feet Assistive device: Rolling walker (2 wheeled);Straight cane Gait Pattern/deviations: Step-through pattern;Decreased stride length   Gait velocity interpretation: at or above normal speed for age/gender General Gait Details: pt walked first 150'  with cane without LOB but uses cane on right despite education and use of AFO on right but states this is his habit and too old to change. Last 150' with RW due to fatigue with cane  Stairs Stairs: Yes Stairs assistance: Modified independent (Device/Increase time) Stair Management: One rail Right;Alternating pattern;Forwards Number of Stairs: 2    Wheelchair Mobility    Modified Rankin (Stroke Patients Only)       Balance Overall balance assessment: Needs assistance   Sitting balance-Leahy Scale: Good       Standing balance-Leahy Scale: Fair                               Pertinent Vitals/Pain Pain Assessment: No/denies pain  sats 92-96% on RA throughout HR 79-93 BP 159/81    Home Living Family/patient expects to be discharged to:: Private residence Living Arrangements: Alone Available Help at Discharge: Family;Available 24 hours/day Type of Home: House Home Access: Stairs to enter   [employer] of Steps: 2 Home Layout: One level Home Equipment: Walker - 4 wheels;Cane - single point      Prior Function Level of Independence: Independent with assistive device(s)         Comments: uses cane and AFO and rollator out of house at times     Hand Dominance        Extremity/Trunk Assessment   Upper Extremity Assessment: Overall WFL for tasks assessed           Lower Extremity Assessment: Generalized weakness      Cervical / Trunk Assessment: Normal  Communication   Communication: No difficulties  Cognition Arousal/Alertness: Awake/alert Behavior During Therapy: WFL for tasks assessed/performed Overall Cognitive Status: Within Functional Limits for tasks assessed                      General Comments      Exercises        Assessment/Plan    PT Assessment Patient needs continued PT services  PT Diagnosis Difficulty walking   PT Problem List Decreased balance;Decreased knowledge of use of DME;Decreased  activity tolerance  PT Treatment Interventions Gait training;DME instruction;Functional mobility training;Patient/family education;Therapeutic exercise;Therapeutic activities   PT Goals (Current goals can be found in the Care Plan section) Acute Rehab PT Goals Patient Stated Goal: return home soon PT Goal Formulation: With patient Time For Goal Achievement: 12/11/14 Potential to Achieve Goals: Good    Frequency Min 3X/week   Barriers to discharge   pt states he can stay with dgtr who is available 24hrs    Co-evaluation               End of Session   Activity Tolerance: Patient tolerated treatment well Patient left: in chair;with call bell/phone within reach Nurse Communication: Mobility status         Time: 4580-9983 PT Time Calculation (min) (ACUTE ONLY): 23 min   Charges:   PT Evaluation $Initial PT Evaluation Tier I: 1 Procedure PT Treatments $Gait Training: 8-22 mins   PT G CodesMelford Aase 12/04/2014, 10:58 AM Elwyn Reach, [place]

## 2014-12-04 NOTE — Progress Notes (Signed)
Ref: No primary care provider on file.   Subjective:  Feeling better. Afebrile. Troponin-I trending downward. No right groin discharge or swelling.  Objective:  Vital Signs in the last 24 hours: Temp:  [97.3 F (36.3 C)-98.8 F (37.1 C)] 98.8 F (37.1 C) (02/13 0800) Pulse Rate:  [38-127] 105 (02/13 1100) Cardiac Rhythm:  [-] Atrial fibrillation (02/13 0721) Resp:  [0-34] 24 (02/13 1100) BP: (111-159)/(51-106) 118/77 mmHg (02/13 1100) SpO2:  [88 %-99 %] 93 % (02/13 1100)  Physical Exam: BP Readings from Last 1 Encounters:  12/04/14 118/77    Wt Readings from Last 1 Encounters:  12/02/14 88.1 kg (194 lb 3.6 oz)    Weight change:   HEENT: [place]/AT, Eyes- PERL, EOMI, Conjunctiva-Pink, Sclera-Non-icteric Neck: No JVD, No bruit, Trachea midline. Lungs:  Clear, Bilateral. Cardiac:  Irregular rhythm, normal S1 and S2, no S3. II/VI systolic murmur. Abdomen:  Soft, non-tender. Extremities:  No edema present. No cyanosis. No clubbing. CNS: AxOx3, Cranial nerves grossly intact, moves all 4 extremities. Right handed. Skin: Warm and dry.   Intake/Output from previous day: 02/12 0701 - 02/13 0700 In: 1020 [P.O.:570; I.V.:450] Out: 1100 [Urine:1100]    Lab Results: BMET    Component Value Date/Time   NA 141 12/04/2014 0307   NA 140 12/03/2014 0240   NA 137 12/02/2014 2116   K 4.0 12/04/2014 0307   K 4.1 12/03/2014 0240   K 4.0 12/02/2014 2116   CL 108 12/04/2014 0307   CL 109 12/03/2014 0240   CL 107 12/02/2014 2116   CO2 25 12/04/2014 0307   CO2 24 12/03/2014 0240   CO2 23 12/02/2014 2116   GLUCOSE 110* 12/04/2014 0307   GLUCOSE 99 12/03/2014 0240   GLUCOSE 111* 12/02/2014 2116   BUN 28* 12/04/2014 0307   BUN 21 12/03/2014 0240   BUN 22 12/02/2014 2116   CREATININE 1.45* 12/04/2014 0307   CREATININE 1.34 12/03/2014 0240   CREATININE 1.40* 12/02/2014 2116   CALCIUM 7.9* 12/04/2014 0307   CALCIUM 7.9* 12/03/2014 0240   CALCIUM 7.7* 12/02/2014 2116   GFRNONAA 42*  12/04/2014 0307   GFRNONAA 46* 12/03/2014 0240   GFRNONAA 44* 12/02/2014 2116   GFRAA 48* 12/04/2014 0307   GFRAA 53* 12/03/2014 0240   GFRAA 51* 12/02/2014 2116   CBC    Component Value Date/Time   WBC 8.8 12/04/2014 0307   RBC 3.60* 12/04/2014 0307   RBC 3.81* 02/06/2010 1549   HGB 11.2* 12/04/2014 0307   HCT 34.4* 12/04/2014 0307   PLT 241 12/04/2014 0307   MCV 95.6 12/04/2014 0307   MCH 31.1 12/04/2014 0307   MCHC 32.6 12/04/2014 0307   RDW 13.2 12/04/2014 0307   LYMPHSABS 1.1 12/02/2014 1525   MONOABS 0.5 12/02/2014 1525   EOSABS 0.0 12/02/2014 1525   BASOSABS 0.0 12/02/2014 1525   HEPATIC Function Panel  Recent Labs  12/02/14 2116  PROT 6.0   HEMOGLOBIN A1C No components found for: HGA1C,  MPG CARDIAC ENZYMES Lab Results  Component Value Date   CKTOTAL 125 02/07/2010   CKMB 1.8 02/07/2010   TROPONINI 2.34* 12/04/2014   TROPONINI 3.00* 12/03/2014   TROPONINI 3.23* 12/03/2014   BNP No results for input(s): PROBNP in the last 8760 hours. TSH No results for input(s): TSH in the last 8760 hours. CHOLESTEROL  Recent Labs  12/03/14 0240  CHOL 113    Scheduled Meds: . aspirin  81 mg Oral Daily  . atorvastatin  80 mg Oral q1800  . clopidogrel  75 mg Oral Q breakfast  . ferrous sulfate  325 mg Oral TID WC  . lisinopril  10 mg Oral Daily  . metoprolol succinate  12.5 mg Oral Daily  . pantoprazole  40 mg Oral Daily   Continuous Infusions: . nitroGLYCERIN     PRN Meds:.acetaminophen, nitroGLYCERIN, ondansetron (ZOFRAN) IV, senna-docusate, zolpidem  Assessment/Plan: Acute coronary syndrome  Status post left cardiac cath/PTCA stenting to saphenous vein graft to LAD and protected left main and left circumflex (with excellent angiographic results) Coronary artery disease  Status post CABG (in 1983) Hypertension Mild acute congestive heart failure secondary to preserved LV systolic function Hypercholesteremia Chronic atrial fibrillation. COPD Remote  tobacco abuse Chronic kidney disease stage II History of renal stones Degenerative joint disease History of pancreatitis Acute anemia secondary to hydration and blood loss during the procedure  Transfer to telemetry. Increase activity.     LOS: 2 days    Dixie Dials  MD  12/04/2014, 12:26 PM

## 2014-12-04 NOTE — Progress Notes (Signed)
1025-1050 Pt just walked with PT so will let them make suggestions for discharge. Pt stated he has rollator and walker at home. Brief MI ed done with pt who voiced understanding. Pt knows he needs to take plavix for stent, could recall how to take NTG tabs if needed, and encouraged to eat foods he can tolerate watching salt. Visitor stated pt has lost weight. Pt stated he has not been eating as much as he did in the past but tries to take a boost or ensure daily. Encouraged light walking as tolerated to build up strength. Pt not interested in CRP 2.  Will follow up Monday if not discharged. Graylon Good RN BSN 12/04/2014 10:51 AM

## 2014-12-05 LAB — BASIC METABOLIC PANEL
ANION GAP: 11 (ref 5–15)
BUN: 29 mg/dL — ABNORMAL HIGH (ref 6–23)
CALCIUM: 8.9 mg/dL (ref 8.4–10.5)
CHLORIDE: 106 mmol/L (ref 96–112)
CO2: 24 mmol/L (ref 19–32)
Creatinine, Ser: 1.48 mg/dL — ABNORMAL HIGH (ref 0.50–1.35)
GFR calc Af Amer: 47 mL/min — ABNORMAL LOW (ref >90)
GFR, EST NON AFRICAN AMERICAN: 41 mL/min — AB (ref >90)
Glucose, Bld: 103 mg/dL — ABNORMAL HIGH (ref 70–99)
POTASSIUM: 4.3 mmol/L (ref 3.5–5.1)
Sodium: 141 mmol/L (ref 135–145)

## 2014-12-05 LAB — CBC
HCT: 34.1 % — ABNORMAL LOW (ref 39.0–52.0)
Hemoglobin: 10.9 g/dL — ABNORMAL LOW (ref 13.0–17.0)
MCH: 30.9 pg (ref 26.0–34.0)
MCHC: 32 g/dL (ref 30.0–36.0)
MCV: 96.6 fL (ref 78.0–100.0)
PLATELETS: 275 10*3/uL (ref 150–400)
RBC: 3.53 MIL/uL — ABNORMAL LOW (ref 4.22–5.81)
RDW: 13.4 % (ref 11.5–15.5)
WBC: 7.5 10*3/uL (ref 4.0–10.5)

## 2014-12-05 MED ORDER — AMLODIPINE BESYLATE 10 MG PO TABS: 5.0000 mg | ORAL_TABLET | Freq: Every day | ORAL | Status: DC

## 2014-12-05 MED ORDER — FERROUS SULFATE 325 (65 FE) MG PO TABS
325.0000 mg | ORAL_TABLET | Freq: Two times a day (BID) | ORAL | Status: DC
Start: 2014-12-05 — End: 2016-03-06

## 2014-12-05 MED ORDER — LISINOPRIL 5 MG PO TABS: 5.0000 mg | ORAL_TABLET | Freq: Every day | ORAL | Status: DC

## 2014-12-05 MED ORDER — ATORVASTATIN CALCIUM 80 MG PO TABS: 80.0000 mg | ORAL_TABLET | Freq: Every day | ORAL | Status: DC

## 2014-12-05 MED ORDER — CLOPIDOGREL BISULFATE 75 MG PO TABS: 75.0000 mg | ORAL_TABLET | Freq: Every day | ORAL | Status: DC

## 2014-12-05 MED ORDER — FUROSEMIDE 40 MG PO TABS: 40.0000 mg | ORAL_TABLET | ORAL | Status: DC

## 2014-12-05 NOTE — Discharge Summary (Signed)
Physician Discharge Summary  Patient ID: Gregg Hodges MRN: [id number] DOB/AGE: [birth year] 79 y.o.  Admit date: 12/02/2014 Discharge date: 12/05/2014  Admission Diagnoses: Acute coronary syndrome  Coronary artery disease status post CABG in 1983 Hypertension Mild acute congestive heart failure secondary to preserved LV systolic function Hypercholesteremia Chronic atrial fibrillation. COPD Remote tobacco abuse Chronic kidney disease stage III improved History of renal stones Degenerative joint disease History of pancreatitis   Discharge Diagnoses:  Principle Problem: * Acute NSTEMI * Status post left cardiac cath/PTCA stenting to saphenous vein graft to LAD and protected left main and left circumflex with excellent angiographic results Coronary artery disease  Status post CABG in 1983 Hypertension Mild acute congestive heart failure secondary to preserved LV systolic function Hypercholesteremia Chronic atrial fibrillation. COPD Remote tobacco abuse Chronic kidney disease stage III improving History of renal stones Degenerative joint disease History of pancreatitis Acute anemia secondary to hydration and blood loss during the procedure  Discharged Condition: fair  Hospital Course: Patient is 81 year old male with past medical history significant for coronary artery disease status post CABG in 1983 approximately 33 years ago, hypertension, chronic atrial fibrillation, COPD, degenerative joint disease, history of pancreatitis in the past, history of abdominal aortic aneurysm status post repair in the past, remote tobacco abuse, came to the ER by EMS complaining of recurrent retrosternal chest pain described as pressure grade 5/10 radiating to her shoulder and back associated with progressive increasing shortness of breath and nausea for last few days. He had EKG changes of A. fib with controlled ventricular response right bundle branch block and left anterior fascicular block  with secondary ST-T wave changes, and was noted to have elevated troponin I of 2.56. He underwent left heart cath by doctor [facility] and had PTCA stenting to saphenous vein graft to LAD and protected left main and left circumflex with excellent angiographic results. His mild kidney disease remained stable. He was advised to increase fluid intake and decrease his lasix dose by 50 %. He was discharged home with follow up by Dr. Terrence Dupont in 2 weeks.  Consults: cardiology  Significant Diagnostic Studies: labs: Near normal CBC with mild anemia and Hgb of 10.9.Near normal BMET with mild elevation of BUN/Cr to 28/1.45 and borderline hyperglycemia. Lipid level normal except HDL of 23. Chest x-ray: Emphysema. EKG: Atrial fibrillation with inferior and lateral ischemia and RBBB. Cardiac cath: PTCA stenting to saphenous vein graft to LAD and protected left main and left circumflex with excellent angiographic results.   Treatments: cardiac meds: lisinopril (Prinivil), amlodipine, furosemide, aspirin, Plavix  and Atorvastatin.  Discharge Exam: Blood pressure 114/76, pulse 56, temperature 97.8 F (36.6 C), temperature source Oral, resp. rate 21, height 5\' 11"  (1.803 m), weight 92.08 kg (203 lb), SpO2 98 %. HEENT: [place]/AT, Eyes- PERL, EOMI, Conjunctiva-Pink, Sclera-Non-icteric Neck: No JVD, No bruit, Trachea midline. Lungs: Clear, Bilateral. Cardiac: Irregular rhythm, normal S1 and S2, no S3. II/VI systolic murmur. Abdomen: Soft, non-tender. Extremities: Trace ankle edema present. No cyanosis. No clubbing. CNS: AxOx3, Cranial nerves grossly intact, moves all 4 extremities. Right handed. Skin: Warm and dry  Disposition: 01-Home or Self Care     Medication List    STOP taking these medications        hydrALAZINE 25 MG tablet  Commonly known as:  APRESOLINE     HYDROcodone-acetaminophen 5-325 MG per tablet  Commonly known as:  NORCO/VICODIN     meloxicam 15 MG tablet  Commonly known as:  MOBIC      simvastatin  40 MG tablet  Commonly known as:  ZOCOR      TAKE these medications        amLODipine 10 MG tablet  Commonly known as:  NORVASC  Take 0.5 tablets (5 mg total) by mouth daily.     aspirin 81 MG chewable tablet  Chew 81 mg by mouth daily.     atorvastatin 80 MG tablet  Commonly known as:  LIPITOR  Take 1 tablet (80 mg total) by mouth daily at 6 PM.     clopidogrel 75 MG tablet  Commonly known as:  PLAVIX  Take 1 tablet (75 mg total) by mouth daily with breakfast.     ferrous sulfate 325 (65 FE) MG tablet  Take 1 tablet (325 mg total) by mouth 2 (two) times daily with a meal.     furosemide 40 MG tablet  Commonly known as:  LASIX  Take 1 tablet (40 mg total) by mouth every Monday, Wednesday, and Friday.     lisinopril 5 MG tablet  Commonly known as:  PRINIVIL,ZESTRIL  Take 1 tablet (5 mg total) by mouth daily.     nitroGLYCERIN 0.4 MG SL tablet  Commonly known as:  NITROSTAT  Place 0.4 mg under the tongue every 5 (five) minutes as needed for chest pain.     omeprazole 20 MG capsule  Commonly known as:  PRILOSEC  Take 20 mg by mouth daily.           Follow-up Information    Follow up with Clent Demark, MD. Schedule an appointment as soon as possible for a visit in 2 weeks.   Specialty:  Cardiology   Contact information:   [facility] [address] [place] [address] [phone]       Signed: Birdie Riddle 12/05/2014, 1:27 PM

## 2014-12-05 NOTE — Progress Notes (Signed)
CSW spoke with pt and his children at bedside.  Pt plans on discharging home with his daughter, who can provide care.  Pt is not interested in SNF placement at this time.  If pt requires HHC, he is agreeable to using [facility] as his provider.  CSW will sign off at this time.

## 2015-04-18 ENCOUNTER — Other Ambulatory Visit: Payer: Self-pay

## 2015-05-13 ENCOUNTER — Encounter (HOSPITAL_COMMUNITY): Payer: Self-pay | Admitting: Emergency Medicine

## 2015-05-13 ENCOUNTER — Emergency Department (HOSPITAL_COMMUNITY): Payer: Medicare Other

## 2015-05-13 ENCOUNTER — Emergency Department (HOSPITAL_COMMUNITY)
Admission: EM | Admit: 2015-05-13 | Discharge: 2015-05-13 | Disposition: A | Discharge status: Home / Self Care | Payer: Medicare Other | Attending: Emergency Medicine | Admitting: Emergency Medicine

## 2015-05-13 DIAGNOSIS — I252 Old myocardial infarction: Secondary | ICD-10-CM | POA: Diagnosis not present

## 2015-05-13 DIAGNOSIS — Z79899 Other long term (current) drug therapy: Secondary | ICD-10-CM | POA: Diagnosis not present

## 2015-05-13 DIAGNOSIS — R0602 Shortness of breath: Secondary | ICD-10-CM | POA: Diagnosis present

## 2015-05-13 DIAGNOSIS — I2510 Atherosclerotic heart disease of native coronary artery without angina pectoris: Secondary | ICD-10-CM | POA: Insufficient documentation

## 2015-05-13 DIAGNOSIS — I509 Heart failure, unspecified: Secondary | ICD-10-CM | POA: Diagnosis not present

## 2015-05-13 DIAGNOSIS — Z7902 Long term (current) use of antithrombotics/antiplatelets: Secondary | ICD-10-CM | POA: Insufficient documentation

## 2015-05-13 DIAGNOSIS — Z87442 Personal history of urinary calculi: Secondary | ICD-10-CM | POA: Diagnosis not present

## 2015-05-13 DIAGNOSIS — R0600 Dyspnea, unspecified: Secondary | ICD-10-CM | POA: Diagnosis not present

## 2015-05-13 DIAGNOSIS — M1990 Unspecified osteoarthritis, unspecified site: Secondary | ICD-10-CM | POA: Diagnosis not present

## 2015-05-13 DIAGNOSIS — Z7982 Long term (current) use of aspirin: Secondary | ICD-10-CM | POA: Diagnosis not present

## 2015-05-13 DIAGNOSIS — Z951 Presence of aortocoronary bypass graft: Secondary | ICD-10-CM | POA: Diagnosis not present

## 2015-05-13 DIAGNOSIS — Z87891 Personal history of nicotine dependence: Secondary | ICD-10-CM | POA: Diagnosis not present

## 2015-05-13 DIAGNOSIS — Z85819 Personal history of malignant neoplasm of unspecified site of lip, oral cavity, and pharynx: Secondary | ICD-10-CM | POA: Diagnosis not present

## 2015-05-13 DIAGNOSIS — Z9889 Other specified postprocedural states: Secondary | ICD-10-CM | POA: Diagnosis not present

## 2015-05-13 DIAGNOSIS — I10 Essential (primary) hypertension: Secondary | ICD-10-CM | POA: Insufficient documentation

## 2015-05-13 LAB — CBC WITH DIFFERENTIAL/PLATELET
Basophils Absolute: 0 10*3/uL (ref 0.0–0.1)
Basophils Relative: 1 % (ref 0–1)
Eosinophils Absolute: 0.2 10*3/uL (ref 0.0–0.7)
Eosinophils Relative: 3 % (ref 0–5)
HCT: 41.5 % (ref 39.0–52.0)
Hemoglobin: 13.3 g/dL (ref 13.0–17.0)
Lymphocytes Relative: 15 % (ref 12–46)
Lymphs Abs: 0.9 10*3/uL (ref 0.7–4.0)
MCH: 30.6 pg (ref 26.0–34.0)
MCHC: 32 g/dL (ref 30.0–36.0)
MCV: 95.6 fL (ref 78.0–100.0)
Monocytes Absolute: 0.4 10*3/uL (ref 0.1–1.0)
Monocytes Relative: 7 % (ref 3–12)
Neutro Abs: 4.4 10*3/uL (ref 1.7–7.7)
Neutrophils Relative %: 74 % (ref 43–77)
Platelets: 168 10*3/uL (ref 150–400)
RBC: 4.34 MIL/uL (ref 4.22–5.81)
RDW: 15.4 % (ref 11.5–15.5)
WBC: 5.9 10*3/uL (ref 4.0–10.5)

## 2015-05-13 LAB — COMPREHENSIVE METABOLIC PANEL
ALBUMIN: 3.8 g/dL (ref 3.5–5.0)
ALK PHOS: 92 U/L (ref 38–126)
ALT: 14 U/L — AB (ref 17–63)
AST: 25 U/L (ref 15–41)
Anion gap: 6 (ref 5–15)
BILIRUBIN TOTAL: 1.4 mg/dL — AB (ref 0.3–1.2)
BUN: 27 mg/dL — AB (ref 6–20)
CO2: 28 mmol/L (ref 22–32)
CREATININE: 1.64 mg/dL — AB (ref 0.61–1.24)
Calcium: 9.2 mg/dL (ref 8.9–10.3)
Chloride: 107 mmol/L (ref 101–111)
GFR calc Af Amer: 42 mL/min — ABNORMAL LOW (ref >60)
GFR calc non Af Amer: 36 mL/min — ABNORMAL LOW (ref >60)
Glucose, Bld: 94 mg/dL (ref 65–99)
Potassium: 4.8 mmol/L (ref 3.5–5.1)
SODIUM: 141 mmol/L (ref 135–145)
Total Protein: 7.9 g/dL (ref 6.5–8.1)

## 2015-05-13 LAB — TROPONIN I: Troponin I: <0.03 ng/mL (ref <0.031)

## 2015-05-13 MED ORDER — FUROSEMIDE 10 MG/ML IJ SOLN
40.0000 mg | Freq: Once | INTRAMUSCULAR | Status: AC
  Administered 2015-05-13: 40 mg via INTRAVENOUS
  Filled 2015-05-13: qty 4

## 2015-05-13 MED ORDER — FUROSEMIDE 20 MG PO TABS: 20.0000 mg | ORAL_TABLET | Freq: Every day | ORAL | Status: DC

## 2015-05-13 NOTE — ED Notes (Signed)
Pt c/o SOB for the last 2 weeks. Gradually worsened. Pt with severe exertional SOB and when lying flat in the bed.

## 2015-05-13 NOTE — Discharge Instructions (Signed)
Tests showed no extra fluid in your lungs or evidence of a heart attack.   Continue support hose, decrease salt in your diet, elevate legs, add Lasix 20 mg in the mid afternoon. Follow-up your doctor next week.

## 2015-05-13 NOTE — ED Provider Notes (Signed)
CSN: [id number]     Arrival date & time 05/13/15  1327 History   First MD Initiated Contact with Patient 05/13/15 1357     Chief Complaint  Patient presents with  . Shortness of Breath     (Consider location/radiation/quality/duration/timing/severity/associated sxs/prior Treatment) HPI..... Patient complains of worsening dyspnea on exertion for several weeks. He notices excessive swelling in his legs. No chest pain. He is status post MI with stents in the past and a history of congestive heart failure. He has compression hose and takes Lasix 40 mg in the morning for a diuretic effect. He is ambulatory. He has chronic atrial fibrillation  Past Medical History  Diagnosis Date  . Arthritis   . Cancer     Malignant adnoid carcinoma (roof of mouth)  . CHF (congestive heart failure)   . Coronary artery disease   . Hypertension   . Renal disorder     kidney stones, resulted in blockage of L kidney, later removed due to damage  . Complication of anesthesia     only to ether   Past Surgical History  Procedure Laterality Date  . Cardiac surgery      CABG  . Cholecystectomy    . Esophagogastroduodenoscopy    . Abdominal aortic aneurysm repair    . Ercp N/A 02/25/2013    Procedure: ENDOSCOPIC RETROGRADE CHOLANGIOPANCREATOGRAPHY (ERCP);  Surgeon: Jeryl Columbia, MD;  Location: [facility];  Service: Endoscopy;  Laterality: N/A;  . Left heart catheterization with coronary/graft angiogram N/A 12/02/2014    Procedure: LEFT HEART CATHETERIZATION WITH CORONARY/GRAFT ANGIOGRAM;  Surgeon: Clent Demark, MD;  Location: [facility] CATH LAB;  Service: Cardiovascular;  Laterality: N/A;   No family history on file. History  Substance Use Topics  . Smoking status: Former Smoker    Types: Cigarettes    Quit date: 02/24/1967  . Smokeless tobacco: Never Used  . Alcohol Use: No    Review of Systems  All other systems reviewed and are negative.     Allergies  Ether and Isosorbide mononitrate  Home  Medications   Prior to Admission medications   Medication Sig Start Date End Date Taking? Authorizing Provider  amLODipine (NORVASC) 10 MG tablet Take 0.5 tablets (5 mg total) by mouth daily. 12/05/14   Dixie Dials, MD  aspirin 81 MG chewable tablet Chew 81 mg by mouth daily.    Historical Provider, MD  atorvastatin (LIPITOR) 80 MG tablet Take 1 tablet (80 mg total) by mouth daily at 6 PM. 12/05/14   Dixie Dials, MD  clopidogrel (PLAVIX) 75 MG tablet Take 1 tablet (75 mg total) by mouth daily with breakfast. 12/05/14   Dixie Dials, MD  ferrous sulfate 325 (65 FE) MG tablet Take 1 tablet (325 mg total) by mouth 2 (two) times daily with a meal. 12/05/14   Dixie Dials, MD  furosemide (LASIX) 20 MG tablet Take 1 tablet (20 mg total) by mouth daily. 05/13/15   Nat Christen, MD  lisinopril (PRINIVIL,ZESTRIL) 5 MG tablet Take 1 tablet (5 mg total) by mouth daily. 12/05/14   Dixie Dials, MD  nitroGLYCERIN (NITROSTAT) 0.4 MG SL tablet Place 0.4 mg under the tongue every 5 (five) minutes as needed for chest pain.    Historical Provider, MD  omeprazole (PRILOSEC) 20 MG capsule Take 20 mg by mouth daily.    Historical Provider, MD   BP 131/67 mmHg  Pulse 54  Temp(Src) 97.6 F (36.4 C) (Oral)  Resp 32  Ht 6\' 1"  (1.854 m)  Wt  207 lb 5 oz (94.036 kg)  BMI 27.36 kg/m2  SpO2 94% Physical Exam  Constitutional: He is oriented to person, place, and time. He appears well-developed and well-nourished.  HENT:  Head: Normocephalic and atraumatic.  Eyes: Conjunctivae and EOM are normal. Pupils are equal, round, and reactive to light.  Neck: Normal range of motion. Neck supple.  Cardiovascular: Normal rate and regular rhythm.   Pulmonary/Chest: Effort normal and breath sounds normal.  Abdominal: Soft. Bowel sounds are normal.  Musculoskeletal: Normal range of motion.  Neurological: He is alert and oriented to person, place, and time.  Skin: Skin is warm and dry.  3+ peripheral edema  Psychiatric: He has a  normal mood and affect. His behavior is normal.  Nursing note and vitals reviewed.   ED Course  Procedures (including critical care time) Labs Review Labs Reviewed  COMPREHENSIVE METABOLIC PANEL - Abnormal; Notable for the following:    BUN 27 (*)    Creatinine, Ser 1.64 (*)    ALT 14 (*)    Total Bilirubin 1.4 (*)    GFR calc non Af Amer 36 (*)    GFR calc Af Amer 42 (*)    All other components within normal limits  CBC WITH DIFFERENTIAL/PLATELET  TROPONIN I    Imaging Review Dg Chest 2 View  05/13/2015   CLINICAL DATA:  Several weeks of chronic cough  EXAM: CHEST  2 VIEW  COMPARISON:  December 02, 2014  FINDINGS: The lungs are hyperexpanded. There is slight scarring in the bases. There is no edema or consolidation. Heart is upper normal in size with pulmonary vascularity within normal limits. No adenopathy. Patient is status post median sternotomy. There is degenerative change in thoracic spine.  IMPRESSION: Mild scarring in the bases. No edema or consolidation. No change in cardiac silhouette.   Electronically Signed   By: Lowella Grip III M.D.   On: 05/13/2015 15:05     EKG Interpretation   Date/Time:  Friday May 13 2015 13:31:35 EDT Ventricular Rate:  54 PR Interval:    QRS Duration: 122 QT Interval:  502 QTC Calculation: 476 R Axis:   -98 Text Interpretation:  Atrial fibrillation with slow ventricular response  Right bundle branch block Anterior infarct , age undetermined Abnormal ECG  No significant change since last tracing Confirmed by Winfred Leeds  MD, SAM  [phone]) on 05/13/2015 1:35:13 PM Also confirmed by Lacinda Axon  MD, Jakyria Bleau ([id number])   on 05/13/2015 2:30:57 PM      MDM   Final diagnoses:  Dyspnea    Patient is in no acute distress. Chest x-ray negative. Troponin negative. Hemoglobin stable. Will recommend increasing Lasix to 60 mg per day, 40 in the morning and 20 in the midafternoon. He will see his doctor on Wednesday in the [place] system. Discussed findings  with patient and his granddaughter    Nat Christen, MD 05/13/15 1700

## 2015-05-13 NOTE — ED Notes (Signed)
Pt. Stated, I've been having SOB for about 2 weeks and its getting worse.

## 2016-03-06 ENCOUNTER — Observation Stay (HOSPITAL_COMMUNITY)
Admission: EM | Admit: 2016-03-06 | Discharge: 2016-03-07 | Disposition: A | Discharge status: Home / Self Care | Payer: Medicare Other | Attending: Cardiology | Admitting: Cardiology

## 2016-03-06 ENCOUNTER — Encounter (HOSPITAL_COMMUNITY): Payer: Self-pay | Admitting: Emergency Medicine

## 2016-03-06 ENCOUNTER — Emergency Department (HOSPITAL_COMMUNITY): Payer: Medicare Other

## 2016-03-06 DIAGNOSIS — I482 Chronic atrial fibrillation: Secondary | ICD-10-CM | POA: Diagnosis not present

## 2016-03-06 DIAGNOSIS — E785 Hyperlipidemia, unspecified: Secondary | ICD-10-CM | POA: Diagnosis not present

## 2016-03-06 DIAGNOSIS — Z955 Presence of coronary angioplasty implant and graft: Secondary | ICD-10-CM | POA: Diagnosis not present

## 2016-03-06 DIAGNOSIS — Z7902 Long term (current) use of antithrombotics/antiplatelets: Secondary | ICD-10-CM | POA: Insufficient documentation

## 2016-03-06 DIAGNOSIS — Z85818 Personal history of malignant neoplasm of other sites of lip, oral cavity, and pharynx: Secondary | ICD-10-CM | POA: Insufficient documentation

## 2016-03-06 DIAGNOSIS — N183 Chronic kidney disease, stage 3 (moderate): Secondary | ICD-10-CM | POA: Insufficient documentation

## 2016-03-06 DIAGNOSIS — Z87891 Personal history of nicotine dependence: Secondary | ICD-10-CM | POA: Insufficient documentation

## 2016-03-06 DIAGNOSIS — I130 Hypertensive heart and chronic kidney disease with heart failure and stage 1 through stage 4 chronic kidney disease, or unspecified chronic kidney disease: Secondary | ICD-10-CM | POA: Insufficient documentation

## 2016-03-06 DIAGNOSIS — Z951 Presence of aortocoronary bypass graft: Secondary | ICD-10-CM | POA: Insufficient documentation

## 2016-03-06 DIAGNOSIS — J449 Chronic obstructive pulmonary disease, unspecified: Secondary | ICD-10-CM | POA: Insufficient documentation

## 2016-03-06 DIAGNOSIS — I200 Unstable angina: Secondary | ICD-10-CM | POA: Diagnosis present

## 2016-03-06 DIAGNOSIS — Z79899 Other long term (current) drug therapy: Secondary | ICD-10-CM | POA: Diagnosis not present

## 2016-03-06 DIAGNOSIS — I340 Nonrheumatic mitral (valve) insufficiency: Secondary | ICD-10-CM | POA: Diagnosis not present

## 2016-03-06 DIAGNOSIS — R079 Chest pain, unspecified: Secondary | ICD-10-CM | POA: Diagnosis present

## 2016-03-06 DIAGNOSIS — I509 Heart failure, unspecified: Secondary | ICD-10-CM | POA: Diagnosis not present

## 2016-03-06 DIAGNOSIS — Z7982 Long term (current) use of aspirin: Secondary | ICD-10-CM | POA: Diagnosis not present

## 2016-03-06 DIAGNOSIS — I252 Old myocardial infarction: Secondary | ICD-10-CM | POA: Insufficient documentation

## 2016-03-06 DIAGNOSIS — I25118 Atherosclerotic heart disease of native coronary artery with other forms of angina pectoris: Principal | ICD-10-CM | POA: Insufficient documentation

## 2016-03-06 DIAGNOSIS — M1990 Unspecified osteoarthritis, unspecified site: Secondary | ICD-10-CM | POA: Insufficient documentation

## 2016-03-06 HISTORY — DX: Chronic kidney disease, stage 3 unspecified: N18.30

## 2016-03-06 HISTORY — DX: Angina pectoris, unspecified: I20.9

## 2016-03-06 HISTORY — DX: Cardiac murmur, unspecified: R01.1

## 2016-03-06 HISTORY — DX: Chronic kidney disease, stage 3 (moderate): N18.3

## 2016-03-06 HISTORY — DX: Gastro-esophageal reflux disease without esophagitis: K21.9

## 2016-03-06 HISTORY — DX: Acute myocardial infarction, unspecified: I21.9

## 2016-03-06 LAB — BASIC METABOLIC PANEL
Anion gap: 9 (ref 5–15)
BUN: 39 mg/dL — ABNORMAL HIGH (ref 6–20)
CHLORIDE: 104 mmol/L (ref 101–111)
CO2: 26 mmol/L (ref 22–32)
CREATININE: 1.66 mg/dL — AB (ref 0.61–1.24)
Calcium: 8.7 mg/dL — ABNORMAL LOW (ref 8.9–10.3)
GFR calc non Af Amer: 35 mL/min — ABNORMAL LOW (ref >60)
GFR, EST AFRICAN AMERICAN: 41 mL/min — AB (ref >60)
Glucose, Bld: 90 mg/dL (ref 65–99)
POTASSIUM: 4.2 mmol/L (ref 3.5–5.1)
Sodium: 139 mmol/L (ref 135–145)

## 2016-03-06 LAB — TROPONIN I
TROPONIN I: 0.05 ng/mL — AB (ref <0.031)
Troponin I: 0.04 ng/mL — ABNORMAL HIGH (ref <0.031)

## 2016-03-06 LAB — CBC
HEMATOCRIT: 39.4 % (ref 39.0–52.0)
Hemoglobin: 12.1 g/dL — ABNORMAL LOW (ref 13.0–17.0)
MCH: 29.2 pg (ref 26.0–34.0)
MCHC: 30.7 g/dL (ref 30.0–36.0)
MCV: 94.9 fL (ref 78.0–100.0)
PLATELETS: 142 10*3/uL — AB (ref 150–400)
RBC: 4.15 MIL/uL — AB (ref 4.22–5.81)
RDW: 15 % (ref 11.5–15.5)
WBC: 5.4 10*3/uL (ref 4.0–10.5)

## 2016-03-06 LAB — I-STAT TROPONIN, ED: Troponin i, poc: 0 ng/mL (ref 0.00–0.08)

## 2016-03-06 LAB — HEPARIN LEVEL (UNFRACTIONATED): HEPARIN UNFRACTIONATED: 0.51 [IU]/mL (ref 0.30–0.70)

## 2016-03-06 MED ORDER — ACETAMINOPHEN 325 MG PO TABS: 650.0000 mg | ORAL_TABLET | ORAL | Status: DC | PRN

## 2016-03-06 MED ORDER — ONDANSETRON HCL 4 MG/2ML IJ SOLN: 4.0000 mg | Freq: Four times a day (QID) | INTRAMUSCULAR | Status: DC | PRN

## 2016-03-06 MED ORDER — PANTOPRAZOLE SODIUM 40 MG PO TBEC
40.0000 mg | DELAYED_RELEASE_TABLET | Freq: Every day | ORAL | Status: DC
  Administered 2016-03-07: 40 mg via ORAL
  Filled 2016-03-06 (×2): qty 1

## 2016-03-06 MED ORDER — NITROGLYCERIN 0.4 MG SL SUBL: 0.4000 mg | SUBLINGUAL_TABLET | SUBLINGUAL | Status: DC | PRN

## 2016-03-06 MED ORDER — NITROGLYCERIN 2 % TD OINT
0.5000 [in_us] | TOPICAL_OINTMENT | Freq: Four times a day (QID) | TRANSDERMAL | Status: DC
  Administered 2016-03-06 – 2016-03-07 (×4): 0.5 [in_us] via TOPICAL
  Filled 2016-03-06 (×2): qty 30

## 2016-03-06 MED ORDER — SODIUM CHLORIDE 0.9 % IV SOLN
INTRAVENOUS | Status: DC
  Administered 2016-03-06: 17:00:00 via INTRAVENOUS

## 2016-03-06 MED ORDER — HEPARIN BOLUS VIA INFUSION
4000.0000 [IU] | Freq: Once | INTRAVENOUS | Status: AC
  Administered 2016-03-06: 4000 [IU] via INTRAVENOUS
  Filled 2016-03-06: qty 4000

## 2016-03-06 MED ORDER — ATORVASTATIN CALCIUM 80 MG PO TABS
80.0000 mg | ORAL_TABLET | Freq: Every day | ORAL | Status: DC
  Administered 2016-03-06: 80 mg via ORAL
  Filled 2016-03-06: qty 1

## 2016-03-06 MED ORDER — ASPIRIN 300 MG RE SUPP: 300.0000 mg | RECTAL | Status: AC

## 2016-03-06 MED ORDER — FUROSEMIDE 20 MG PO TABS
20.0000 mg | ORAL_TABLET | Freq: Every day | ORAL | Status: DC
  Administered 2016-03-06 – 2016-03-07 (×2): 20 mg via ORAL
  Filled 2016-03-06 (×2): qty 1

## 2016-03-06 MED ORDER — HEPARIN (PORCINE) IN NACL 100-0.45 UNIT/ML-% IJ SOLN
1200.0000 [IU]/h | INTRAMUSCULAR | Status: DC
  Administered 2016-03-06: 1200 [IU]/h via INTRAVENOUS
  Filled 2016-03-06 (×2): qty 250

## 2016-03-06 MED ORDER — AMLODIPINE BESYLATE 5 MG PO TABS
2.5000 mg | ORAL_TABLET | Freq: Every day | ORAL | Status: DC
  Administered 2016-03-06 – 2016-03-07 (×2): 2.5 mg via ORAL
  Filled 2016-03-06 (×2): qty 1

## 2016-03-06 MED ORDER — ASPIRIN 81 MG PO CHEW
81.0000 mg | CHEWABLE_TABLET | Freq: Every day | ORAL | Status: DC
  Administered 2016-03-07: 81 mg via ORAL
  Filled 2016-03-06 (×2): qty 1

## 2016-03-06 MED ORDER — ASPIRIN 81 MG PO CHEW
324.0000 mg | CHEWABLE_TABLET | ORAL | Status: AC
  Administered 2016-03-06: 324 mg via ORAL
  Filled 2016-03-06: qty 4

## 2016-03-06 NOTE — Progress Notes (Signed)
[place] for heparin Indication: chest pain/ACS  Allergies  Allergen Reactions  . Ether     REACTION: Reaction not known  . Isosorbide Mononitrate     REACTION: Blurred vision,and lightheadedness.    Patient Measurements: Height: 6\' 1"  (185.4 cm) Weight: 202 lb 13.2 oz (92 kg) IBW/kg (Calculated) : 79.9 Heparin Dosing Weight: 90.7kg  Vital Signs: Temp: 97.5 F (36.4 C) (05/16 2010) Temp Source: Oral (05/16 2010) BP: 107/50 mmHg (05/16 2010) Pulse Rate: 43 (05/16 2010)  Labs:  Recent Labs  03/06/16 1144 03/06/16 1558 03/06/16 2056 03/06/16 2242  HGB 12.1*  --   --   --   HCT 39.4  --   --   --   PLT 142*  --   --   --   HEPARINUNFRC  --   --   --  0.51  CREATININE 1.66*  --   --   --   TROPONINI  --  0.04* 0.05*  --     Estimated Creatinine Clearance: 34.8 mL/min (by C-G formula based on Cr of 1.66).  Assessment: 83 y.o. male with chest pain for heparin  Goal of Therapy:  Heparin level 0.3-0.7 units/ml Monitor platelets by anticoagulation protocol: Yes   Plan:  Continue Heparin at current rate  Follow-up am labs.  Phillis Knack, PharmD, BCPS  03/06/2016 11:24 PM

## 2016-03-06 NOTE — ED Notes (Signed)
[facility] from home - c/o CP X 10 days, relief with home nitro, no pain on arrival, A/O X4, ambulatory and in NAD  324 ASA

## 2016-03-06 NOTE — Progress Notes (Signed)
Telemetry called to inform of a 2.06 second pause that included a PVC. Patient has no chest pain or signs of cardiac arrest. Will continue to monitor patient.

## 2016-03-06 NOTE — H&P (Signed)
Gregg Hodges is an 82 y.o. male.   Chief Complaint: Recurrent chest pain HPI: Patient is 82 year old male with past rectal history significant for coronary artery disease status post CABG in 1983, status post PTCA stenting to protected left main/left circumflex and saphenous vein graft to LAD in February 2016, hypertension, chronic A. fib refused anticoagulation in the past, COPD, degenerative joint disease, history of abdominal aortic aneurysm status post repair in the past, Remote tobacco abuse, chronic kidney disease stage III, came to the ER complaining of recurrent retrosternal chest pain off-and-on radiating to left shoulder for last 7-10 days states last night woke up with chest pain lasting few minutes to one sublingual nitroglycerin with relief again this morning had similar pain requiring one sublingual nitroglycerin with relief of chest pain patient presently denies any chest pain. EKG done in the ER showed A. fib with controlled ventricular response old inferior wall MI and right bundle branch block no new acute ischemic changes were noted first set of cardiac enzyme is negative. Patient denies any nausea vomiting diaphoresis denies PND orthopnea but complains of mild leg swelling. Denies shortness of breath.  Past Medical History  Diagnosis Date  . Arthritis   . Cancer ([place])     Malignant adnoid carcinoma (roof of mouth)  . CHF (congestive heart failure) ([place])   . Coronary artery disease   . Hypertension   . Renal disorder     kidney stones, resulted in blockage of L kidney, later removed due to damage  . Complication of anesthesia     only to ether    Past Surgical History  Procedure Laterality Date  . Cardiac surgery      CABG  . Cholecystectomy    . Esophagogastroduodenoscopy    . Abdominal aortic aneurysm repair    . Ercp N/A 02/25/2013    Procedure: ENDOSCOPIC RETROGRADE CHOLANGIOPANCREATOGRAPHY (ERCP);  Surgeon: Jeryl Columbia, MD;  Location: [facility];  Service: Endoscopy;   Laterality: N/A;  . Left heart catheterization with coronary/graft angiogram N/A 12/02/2014    Procedure: LEFT HEART CATHETERIZATION WITH CORONARY/GRAFT ANGIOGRAM;  Surgeon: Clent Demark, MD;  Location: [facility] CATH LAB;  Service: Cardiovascular;  Laterality: N/A;    No family history on file. Social History:  reports that he quit smoking about 49 years ago. His smoking use included Cigarettes. He has never used smokeless tobacco. He reports that he does not drink alcohol or use illicit drugs.  Allergies:  Allergies  Allergen Reactions  . Ether     REACTION: Reaction not known  . Isosorbide Mononitrate     REACTION: Blurred vision,and lightheadedness.     (Not in a hospital admission)  Results for orders placed or performed during the hospital encounter of 03/06/16 (from the past 48 hour(s))  Basic metabolic panel     Status: Abnormal   Collection Time: 03/06/16 11:44 AM  Result Value Ref Range   Sodium 139 135 - 145 mmol/L   Potassium 4.2 3.5 - 5.1 mmol/L   Chloride 104 101 - 111 mmol/L   CO2 26 22 - 32 mmol/L   Glucose, Bld 90 65 - 99 mg/dL   BUN 39 (H) 6 - 20 mg/dL   Creatinine, Ser 1.66 (H) 0.61 - 1.24 mg/dL   Calcium 8.7 (L) 8.9 - 10.3 mg/dL   GFR calc non Af Amer 35 (L) >60 mL/min   GFR calc Af Amer 41 (L) >60 mL/min    Comment: (NOTE) The eGFR has been calculated using the  CKD EPI equation. This calculation has not been validated in all clinical situations. eGFR's persistently <60 mL/min signify possible Chronic Kidney Disease.    Anion gap 9 5 - 15  CBC     Status: Abnormal   Collection Time: 03/06/16 11:44 AM  Result Value Ref Range   WBC 5.4 4.0 - 10.5 K/uL   RBC 4.15 (L) 4.22 - 5.81 MIL/uL   Hemoglobin 12.1 (L) 13.0 - 17.0 g/dL   HCT 39.4 39.0 - 52.0 %   MCV 94.9 78.0 - 100.0 fL   MCH 29.2 26.0 - 34.0 pg   MCHC 30.7 30.0 - 36.0 g/dL   RDW 15.0 11.5 - 15.5 %   Platelets 142 (L) 150 - 400 K/uL  I-stat troponin, ED     Status: None   Collection Time:  03/06/16 11:57 AM  Result Value Ref Range   Troponin i, poc 0.00 0.00 - 0.08 ng/mL   Comment 3            Comment: Due to the release kinetics of cTnI, a negative result within the first hours of the onset of symptoms does not rule out myocardial infarction with certainty. If myocardial infarction is still suspected, repeat the test at appropriate intervals.    Dg Chest 2 View  03/06/2016  CLINICAL DATA:  Generalize chest pain for 10 days EXAM: CHEST  2 VIEW COMPARISON:  05/13/2015 FINDINGS: There is moderate cardiac enlargement. Previous median sternotomy and CABG procedure. There is no pleural effusion or edema. No airspace consolidation. Spondylosis noted within the thoracic spine. IMPRESSION: 1. Cardiac enlargement. Electronically Signed   By: Kerby Moors M.D.   On: 03/06/2016 11:43    Review of Systems  Constitutional: Negative for fever, chills and diaphoresis.  Eyes: Negative for photophobia.  Respiratory: Negative for cough, sputum production and shortness of breath.   Cardiovascular: Positive for chest pain and leg swelling. Negative for palpitations, orthopnea and claudication.  Gastrointestinal: Negative for nausea and vomiting.  Genitourinary: Negative for dysuria.  Neurological: Negative for dizziness, weakness and headaches.    Blood pressure 144/81, pulse 57, temperature 97.9 F (36.6 C), temperature source Oral, resp. rate 17, height 6' 1"  (1.854 m), weight 90.719 kg (200 lb), SpO2 100 %. Physical Exam  Constitutional: He is oriented to person, place, and time.  HENT:  Head: Normocephalic and atraumatic.  Eyes: Conjunctivae are normal. Pupils are equal, round, and reactive to light. Left eye exhibits no discharge. No scleral icterus.  Neck: Normal range of motion. Neck supple. No JVD present. No tracheal deviation present.  Cardiovascular:  Irregularly irregular S1 and S2 soft there is  2/6 systolic murmur at the apex and soft diastolic murmur at left lower  sternal border  Respiratory: Effort normal and breath sounds normal. No respiratory distress. He has no wheezes. He has no rales.  GI: Bowel sounds are normal. He exhibits no distension. There is no tenderness. There is no rebound.  Musculoskeletal:  No clubbing cyanosis 1+ edema noted  Neurological: He is alert and oriented to person, place, and time.     Assessment/Plan Unstable angina rule out MI Coronary artery disease status post CABG in 1983 subsequent had PTCA stenting to protected left main/left circumflex and saphenous vein graft to LAD in February 2016 Hypertension Hyperlipidemia Chronic A. fib chadsvasc  score of 5 COPD Remote tobacco abuse Chronic kidney disease stage III History of kidney stones in the past Degenerative joint disease History of pancreatitis in the past Mitral regurgitation Plan  As per orders Discussed with patient and family regarding various options of treatment and agrees for medical management also discussed at length regarding chronic anticoagulation and now agrees.   Charolette Forward, MD 03/06/2016, 12:54 PM

## 2016-03-06 NOTE — ED Provider Notes (Signed)
CSN: [id number]     Arrival date & time 03/06/16  1108 History   First MD Initiated Contact with Patient 03/06/16 1120     Chief Complaint  Patient presents with  . Chest Pain     (Consider location/radiation/quality/duration/timing/severity/associated sxs/prior Treatment) HPI Gregg Hodges is a 82 y.o. male history of known coronary artery disease, status post stent placement in your 2016 by Dr. Terrence Dupont, comes in for evaluation of chest pain. Patient lives alone at home, over the past week and a half he has had worsening chest pain with exertion. Patient states she can typically go to mailbox and do light work around the house without difficulty, but over the past week he has had chest pain walking to the mailbox, relieved with 2 sublingual nitroglycerin and rest. He relates chest pain as his typical anginal pain. He reports associated shortness of breath during these events. Currently taking 81 mg aspirin and Plavix. Denies any fevers, chills, cough, other unusual chest or back pain, numbness or weakness. Denies any discomfort now in the emergency department. Given 324 mg aspirin en route via EMS.  Past Medical History  Diagnosis Date  . Arthritis   . Cancer ([place])     Malignant adnoid carcinoma (roof of mouth)  . CHF (congestive heart failure) ([place])   . Coronary artery disease   . Hypertension   . Renal disorder     kidney stones, resulted in blockage of L kidney, later removed due to damage  . Complication of anesthesia     only to ether   Past Surgical History  Procedure Laterality Date  . Cardiac surgery      CABG  . Cholecystectomy    . Esophagogastroduodenoscopy    . Abdominal aortic aneurysm repair    . Ercp N/A 02/25/2013    Procedure: ENDOSCOPIC RETROGRADE CHOLANGIOPANCREATOGRAPHY (ERCP);  Surgeon: Jeryl Columbia, MD;  Location: [facility] ORS;  Service: Endoscopy;  Laterality: N/A;  . Left heart catheterization with coronary/graft angiogram N/A 12/02/2014    Procedure: LEFT HEART  CATHETERIZATION WITH CORONARY/GRAFT ANGIOGRAM;  Surgeon: Clent Demark, MD;  Location: [facility] CATH LAB;  Service: Cardiovascular;  Laterality: N/A;   No family history on file. Social History  Substance Use Topics  . Smoking status: Former Smoker    Types: Cigarettes    Quit date: 02/24/1967  . Smokeless tobacco: Never Used  . Alcohol Use: No    Review of Systems A 10 point review of systems was completed and was negative except for pertinent positives and negatives as mentioned in the history of present illness     Allergies  Ether and Isosorbide mononitrate  Home Medications   Prior to Admission medications   Medication Sig Start Date End Date Taking? Authorizing Provider  aspirin 81 MG chewable tablet Chew 81 mg by mouth daily.   Yes Historical Provider, MD  atorvastatin (LIPITOR) 80 MG tablet Take 1 tablet (80 mg total) by mouth daily at 6 PM. 12/05/14  Yes Dixie Dials, MD  clopidogrel (PLAVIX) 75 MG tablet Take 1 tablet (75 mg total) by mouth daily with breakfast. 12/05/14  Yes Dixie Dials, MD  furosemide (LASIX) 20 MG tablet Take 1 tablet (20 mg total) by mouth daily. Patient taking differently: Take 40-60 mg by mouth daily. Pt takes 40 mg every day and can take an extra 20 mg if needed 05/13/15  Yes Nat Christen, MD  lisinopril (PRINIVIL,ZESTRIL) 5 MG tablet Take 1 tablet (5 mg total) by mouth daily. 12/05/14  Yes Dixie Dials, MD  nitroGLYCERIN (NITROSTAT) 0.4 MG SL tablet Place 0.4 mg under the tongue every 5 (five) minutes as needed for chest pain.   Yes Historical Provider, MD  omeprazole (PRILOSEC) 20 MG capsule Take 20 mg by mouth daily.   Yes Historical Provider, MD   BP 144/81 mmHg  Pulse 57  Temp(Src) 97.9 F (36.6 C) (Oral)  Resp 17  Ht 6\' 1"  (1.854 m)  Wt 90.719 kg  BMI 26.39 kg/m2  SpO2 100% Physical Exam  Constitutional: He is oriented to person, place, and time. He appears well-developed and well-nourished.  Overall well-appearing Caucasian male, no apparent  distress  HENT:  Head: Normocephalic and atraumatic.  Mouth/Throat: Oropharynx is clear and moist.  Eyes: Conjunctivae are normal. Pupils are equal, round, and reactive to light. Right eye exhibits no discharge. Left eye exhibits no discharge. No scleral icterus.  Neck: Neck supple.  Cardiovascular: Normal rate, regular rhythm and normal heart sounds.   Pulmonary/Chest: Effort normal and breath sounds normal. No respiratory distress. He has no wheezes. He has no rales.  Abdominal: Soft. There is no tenderness.  Musculoskeletal: He exhibits no tenderness.  Neurological: He is alert and oriented to person, place, and time.  Cranial Nerves II-XII grossly intact  Skin: Skin is warm and dry. No rash noted.  Psychiatric: He has a normal mood and affect.  Nursing note and vitals reviewed.   ED Course  Procedures (including critical care time) Labs Review Labs Reviewed  BASIC METABOLIC PANEL - Abnormal; Notable for the following:    BUN 39 (*)    Creatinine, Ser 1.66 (*)    Calcium 8.7 (*)    GFR calc non Af Amer 35 (*)    GFR calc Af Amer 41 (*)    All other components within normal limits  CBC - Abnormal; Notable for the following:    RBC 4.15 (*)    Hemoglobin 12.1 (*)    Platelets 142 (*)    All other components within normal limits  I-STAT TROPOININ, ED    Imaging Review Dg Chest 2 View  03/06/2016  CLINICAL DATA:  Generalize chest pain for 10 days EXAM: CHEST  2 VIEW COMPARISON:  05/13/2015 FINDINGS: There is moderate cardiac enlargement. Previous median sternotomy and CABG procedure. There is no pleural effusion or edema. No airspace consolidation. Spondylosis noted within the thoracic spine. IMPRESSION: 1. Cardiac enlargement. Electronically Signed   By: Kerby Moors M.D.   On: 03/06/2016 11:43   I have personally reviewed and evaluated these images and lab results as part of my medical decision-making.   EKG Interpretation   Date/Time:  Tuesday Mar 06 2016 11:31:56  EDT Ventricular Rate:  58 PR Interval:    QRS Duration: 145 QT Interval:  488 QTC Calculation: 479 R Axis:   -65 Text Interpretation:  Junctional rhythm RBBB and LAFB Inferior infarct,  old Confirmed by RAY MD, Andee Poles [id number]) on 03/06/2016 12:23:18 PM      MDM  Patient with known coronary artery disease, CABG, here with a story concerning for unstable angina. Vital signs are stable, afebrile. 324 mg aspirin given en route via EMS. No discomfort in emergency department. Initial troponin negative. EKG unchanged from prior. Discussed with Dr. Jeanell Sparrow.  Discussed with cardiologist, Dr. Terrence Dupont, will see in ED. Patient admitted. Final diagnoses:  Unstable angina [facility])        Comer Locket, PA-C 03/06/16 [place], MD 03/06/16 1539

## 2016-03-06 NOTE — Progress Notes (Signed)
ANTICOAGULATION CONSULT NOTE - Initial Consult  Pharmacy Consult for heparin Indication: chest pain/ACS  Allergies  Allergen Reactions  . Ether     REACTION: Reaction not known  . Isosorbide Mononitrate     REACTION: Blurred vision,and lightheadedness.    Patient Measurements: Height: 6\' 1"  (185.4 cm) Weight: 200 lb (90.719 kg) IBW/kg (Calculated) : 79.9 Heparin Dosing Weight: 90.7kg  Vital Signs: Temp: 97.9 F (36.6 C) (05/16 1112) Temp Source: Oral (05/16 1112) BP: 149/90 mmHg (05/16 1400) Pulse Rate: 57 (05/16 1400)  Labs:  Recent Labs  03/06/16 1144  HGB 12.1*  HCT 39.4  PLT 142*  CREATININE 1.66*    Estimated Creatinine Clearance: 34.8 mL/min (by C-G formula based on Cr of 1.66).   Medical History: Past Medical History  Diagnosis Date  . Arthritis   . Cancer ([place])     Malignant adnoid carcinoma (roof of mouth)  . CHF (congestive heart failure) ([place])   . Coronary artery disease   . Hypertension   . Renal disorder     kidney stones, resulted in blockage of L kidney, later removed due to damage  . Complication of anesthesia     only to ether    Assessment: 44 yom with CP. Pharmacy consulted to dose heparin for ACS. No AC pta. Hg 12.1, plt 142 on admit. No bleed documented.   Goal of Therapy:  Heparin level 0.3-0.7 units/ml Monitor platelets by anticoagulation protocol: Yes   Plan:  Heparin 4000 unit bolus Start heparin at 1200 units/h 8h HL Daily HL/CBC Mon s/sx bleeding   Elicia Lamp, PharmD, BCPS Clinical Pharmacist Pager [phone] 03/06/2016 2:21 PM

## 2016-03-06 NOTE — ED Notes (Signed)
Attempted report 

## 2016-03-06 NOTE — ED Notes (Signed)
Ordered diet tray 

## 2016-03-07 DIAGNOSIS — I509 Heart failure, unspecified: Secondary | ICD-10-CM | POA: Diagnosis not present

## 2016-03-07 DIAGNOSIS — I25118 Atherosclerotic heart disease of native coronary artery with other forms of angina pectoris: Secondary | ICD-10-CM | POA: Diagnosis not present

## 2016-03-07 DIAGNOSIS — I130 Hypertensive heart and chronic kidney disease with heart failure and stage 1 through stage 4 chronic kidney disease, or unspecified chronic kidney disease: Secondary | ICD-10-CM | POA: Diagnosis not present

## 2016-03-07 DIAGNOSIS — N183 Chronic kidney disease, stage 3 (moderate): Secondary | ICD-10-CM | POA: Diagnosis not present

## 2016-03-07 LAB — CBC
HCT: 38.4 % — ABNORMAL LOW (ref 39.0–52.0)
Hemoglobin: 12.1 g/dL — ABNORMAL LOW (ref 13.0–17.0)
MCH: 29.5 pg (ref 26.0–34.0)
MCHC: 31.5 g/dL (ref 30.0–36.0)
MCV: 93.7 fL (ref 78.0–100.0)
PLATELETS: 159 10*3/uL (ref 150–400)
RBC: 4.1 MIL/uL — AB (ref 4.22–5.81)
RDW: 14.8 % (ref 11.5–15.5)
WBC: 6.5 10*3/uL (ref 4.0–10.5)

## 2016-03-07 LAB — LIPID PANEL
CHOL/HDL RATIO: 3.5 ratio
CHOLESTEROL: 112 mg/dL (ref 0–200)
HDL: 32 mg/dL — AB (ref >40)
LDL Cholesterol: 71 mg/dL (ref 0–99)
Triglycerides: 44 mg/dL (ref <150)
VLDL: 9 mg/dL (ref 0–40)

## 2016-03-07 LAB — BASIC METABOLIC PANEL
Anion gap: 11 (ref 5–15)
BUN: 36 mg/dL — AB (ref 6–20)
CALCIUM: 8.9 mg/dL (ref 8.9–10.3)
CO2: 28 mmol/L (ref 22–32)
CREATININE: 1.68 mg/dL — AB (ref 0.61–1.24)
Chloride: 105 mmol/L (ref 101–111)
GFR, EST AFRICAN AMERICAN: 40 mL/min — AB (ref >60)
GFR, EST NON AFRICAN AMERICAN: 35 mL/min — AB (ref >60)
Glucose, Bld: 90 mg/dL (ref 65–99)
Potassium: 4.7 mmol/L (ref 3.5–5.1)
SODIUM: 144 mmol/L (ref 135–145)

## 2016-03-07 LAB — TROPONIN I: TROPONIN I: 0.05 ng/mL — AB (ref <0.031)

## 2016-03-07 LAB — HEPARIN LEVEL (UNFRACTIONATED): Heparin Unfractionated: 0.46 IU/mL (ref 0.30–0.70)

## 2016-03-07 MED ORDER — CLOPIDOGREL BISULFATE 75 MG PO TABS: 37.5000 mg | ORAL_TABLET | Freq: Every day | ORAL | Status: DC

## 2016-03-07 MED ORDER — APIXABAN 2.5 MG PO TABS: 2.5000 mg | ORAL_TABLET | Freq: Two times a day (BID) | ORAL | Status: DC

## 2016-03-07 MED ORDER — APIXABAN 2.5 MG PO TABS
2.5000 mg | ORAL_TABLET | Freq: Two times a day (BID) | ORAL | Status: DC
  Administered 2016-03-07: 2.5 mg via ORAL
  Filled 2016-03-07: qty 1

## 2016-03-07 MED ORDER — AMLODIPINE BESYLATE 2.5 MG PO TABS: 2.5000 mg | ORAL_TABLET | Freq: Every day | ORAL | Status: DC

## 2016-03-07 NOTE — Care Management Obs Status (Signed)
[place] NOTIFICATION   Patient Details  Name: Gregg Hodges MRN: [id number] Date of Birth: 1925/10/31   Medicare Observation Status Notification Given:  Yes    Dawayne Patricia, RN 03/07/2016, 11:19 AM

## 2016-03-07 NOTE — Discharge Instructions (Signed)
Angina Pectoris Angina pectoris is a very bad feeling in the chest, neck, or arm. Your doctor may call it angina. There are four types of angina. Angina is caused by a lack of blood in the middle and thickest layer of the heart wall (myocardium). Angina may feel like a crushing or squeezing pain in the chest. It may feel like tightness or heavy pressure in the chest. Some people say it feels like gas, heartburn, or indigestion. Some people have symptoms other than pain. These include:  Shortness of breath.  Cold sweats.  Feeling sick to your stomach (nausea).  Feeling light-headed. Many women have chest discomfort and some of the other symptoms. However, women often have different symptoms, such as:  Feeling tired (fatigue).  Feeling nervous for no reason.  Feeling weak for no reason.  Dizziness or fainting. Women may have angina without any symptoms. HOME CARE  Take medicines only as told by your doctor.  Take care of other health issues as told by your doctor. These include:  High blood pressure (hypertension).  Diabetes.  Follow a heart-healthy diet. Your doctor can help you to choose healthy food options and make changes.  Talk to your doctor to learn more about healthy cooking methods and use them. These include:  Roasting.  Grilling.  Broiling.  Baking.  Poaching.  Steaming.  Stir-frying.  Follow an exercise program approved by your doctor.  Keep a healthy weight. Lose weight as told by your doctor.  Rest when you are tired.  Learn to manage stress.  Do not use any tobacco, such as cigarettes, chewing tobacco, or electronic cigarettes. If you need help quitting, ask your doctor.  If you drink alcohol, and your doctor says it is okay, limit yourself to no more than 1 drink per day. One drink equals 12 ounces of beer, 5 ounces of wine, or 1 ounces of hard liquor.  Stop illegal drug use.  Keep all follow-up visits as told by your doctor. This is  important. Do not take these medicines unless your doctor says that you can:  Nonsteroidal anti-inflammatory drugs (NSAIDs). These include:  Ibuprofen.  Naproxen.  Celecoxib.  Vitamin supplements that have vitamin A, vitamin E, or both.  Hormone therapy that contains estrogen with or without progestin. GET HELP RIGHT AWAY IF:  You have pain in your chest, neck, arm, jaw, stomach, or back that:  Lasts more than a few minutes.  Comes back.  Does not get better after you take medicine under your tongue (sublingual nitroglycerin).  You have any of these symptoms for no reason:  Gas, heartburn, or indigestion.  Sweating a lot.  Shortness of breath or trouble breathing.  Feeling sick to your stomach or throwing up.  Feeling more tired than usual.  Feeling nervous or worrying more than usual.  Feeling weak.  Diarrhea.  You are suddenly dizzy or light-headed.  You faint or pass out. These symptoms may be an emergency. Do not wait to see if the symptoms will go away. Get medical help right away. Call your local emergency services (911 in the [place] Do not drive yourself to the hospital.   This information is not intended to replace advice given to you by your health care provider. Make sure you discuss any questions you have with your health care provider.   Document Released: 03/26/2008 Document Revised: 02/22/2015 Document Reviewed: 02/09/2014 Elsevier Interactive Patient Education 2016 [employer] Atrial Fibrillation Atrial fibrillation is a type of irregular or rapid heartbeat (arrhythmia).  In atrial fibrillation, the heart quivers continuously in a chaotic pattern. This occurs when parts of the heart receive disorganized signals that make the heart unable to pump blood normally. This can increase the risk for stroke, heart failure, and other heart-related conditions. There are different types of atrial fibrillation, including:  Paroxysmal atrial fibrillation. This  type starts suddenly, and it usually stops on its own shortly after it starts.  Persistent atrial fibrillation. This type often lasts longer than a week. It may stop on its own or with treatment.  Long-lasting persistent atrial fibrillation. This type lasts longer than 12 months.  Permanent atrial fibrillation. This type does not go away. Talk with your health care provider to learn about the type of atrial fibrillation that you have. CAUSES This condition is caused by some heart-related conditions or procedures, including:  A heart attack.  Coronary artery disease.  Heart failure.  Heart valve conditions.  High blood pressure.  Inflammation of the sac that surrounds the heart (pericarditis).  Heart surgery.  Certain heart rhythm disorders, such as Wolf-Parkinson-White syndrome. Other causes include:  Pneumonia.  Obstructive sleep apnea.  Blockage of an artery in the lungs (pulmonary embolism, or PE).  Lung cancer.  Chronic lung disease.  Thyroid problems, especially if the thyroid is overactive (hyperthyroidism).  Caffeine.  Excessive alcohol use or illegal drug use.  Use of some medicines, including certain decongestants and diet pills. Sometimes, the cause cannot be found. RISK FACTORS This condition is more likely to develop in:  People who are older in age.  People who smoke.  People who have diabetes mellitus.  People who are overweight (obese).  Athletes who exercise vigorously. SYMPTOMS Symptoms of this condition include:  A feeling that your heart is beating rapidly or irregularly.  A feeling of discomfort or pain in your chest.  Shortness of breath.  Sudden light-headedness or weakness.  Getting tired easily during exercise. In some cases, there are no symptoms. DIAGNOSIS Your health care provider may be able to detect atrial fibrillation when taking your pulse. If detected, this condition may be diagnosed with:  An electrocardiogram  (ECG).  A Holter monitor test that records your heartbeat patterns over a 24-hour period.  Transthoracic echocardiogram (TTE) to evaluate how blood flows through your heart.  Transesophageal echocardiogram (TEE) to view more detailed images of your heart.  A stress test.  Imaging tests, such as a CT scan or chest X-ray.  Blood tests. TREATMENT The main goals of treatment are to prevent blood clots from forming and to keep your heart beating at a normal rate and rhythm. The type of treatment that you receive depends on many factors, such as your underlying medical conditions and how you feel when you are experiencing atrial fibrillation. This condition may be treated with:  Medicine to slow down the heart rate, bring the heart's rhythm back to normal, or prevent clots from forming.  Electrical cardioversion. This is a procedure that resets your heart's rhythm by delivering a controlled, low-energy shock to the heart through your skin.  Different types of ablation, such as catheter ablation, catheter ablation with pacemaker, or surgical ablation. These procedures destroy the heart tissues that send abnormal signals. When the pacemaker is used, it is placed under your skin to help your heart beat in a regular rhythm. HOME CARE INSTRUCTIONS  Take over-the counter and prescription medicines only as told by your health care provider.  If your health care provider prescribed a blood-thinning medicine (anticoagulant), take it  exactly as told. Taking too much blood-thinning medicine can cause bleeding. If you do not take enough blood-thinning medicine, you will not have the protection that you need against stroke and other problems.  Do not use tobacco products, including cigarettes, chewing tobacco, and e-cigarettes. If you need help quitting, ask your health care provider.  If you have obstructive sleep apnea, manage your condition as told by your health care provider.  Do not drink  alcohol.  Do not drink beverages that contain caffeine, such as coffee, soda, and tea.  Maintain a healthy weight. Do not use diet pills unless your health care provider approves. Diet pills may make heart problems worse.  Follow diet instructions as told by your health care provider.  Exercise regularly as told by your health care provider.  Keep all follow-up visits as told by your health care provider. This is important. PREVENTION  Avoid drinking beverages that contain caffeine or alcohol.  Avoid certain medicines, especially medicines that are used for breathing problems.  Avoid certain herbs and herbal medicines, such as those that contain ephedra or ginseng.  Do not use illegal drugs, such as cocaine and amphetamines.  Do not smoke.  Manage your high blood pressure. SEEK MEDICAL CARE IF:  You notice a change in the rate, rhythm, or strength of your heartbeat.  You are taking an anticoagulant and you notice increased bruising.  You tire more easily when you exercise or exert yourself. SEEK IMMEDIATE MEDICAL CARE IF:  You have chest pain, abdominal pain, sweating, or weakness.  You feel nauseous.  You notice blood in your vomit, bowel movement, or urine.  You have shortness of breath.  You suddenly have swollen feet and ankles.  You feel dizzy.  You have sudden weakness or numbness of the face, arm, or leg, especially on one side of the body.  You have trouble speaking, trouble understanding, or both (aphasia).  Your face or your eyelid droops on one side. These symptoms may represent a serious problem that is an emergency. Do not wait to see if the symptoms will go away. Get medical help right away. Call your local emergency services (911 in the [place] Do not drive yourself to the hospital.   This information is not intended to replace advice given to you by your health care provider. Make sure you discuss any questions you have with your health care  provider.   Document Released: 10/08/2005 Document Revised: 06/29/2015 Document Reviewed: 02/02/2015 Elsevier Interactive Patient Education 2016 [place] on my medicine - ELIQUIS (apixaban)  This medication education was reviewed with me or my healthcare representative as part of my discharge preparation.    Why was Eliquis prescribed for you? Eliquis was prescribed for you to reduce the risk of a blood clot forming that can cause a stroke if you have a medical condition called atrial fibrillation (a type of irregular heartbeat).  What do You need to know about Eliquis ? Take your Eliquis TWICE DAILY - one tablet in the morning and one tablet in the evening with or without food. If you have difficulty swallowing the tablet whole please discuss with your pharmacist how to take the medication safely.  Take Eliquis exactly as prescribed by your doctor and DO NOT stop taking Eliquis without talking to the doctor who prescribed the medication.  Stopping may increase your risk of developing a stroke.  Refill your prescription before you run out.  After discharge, you should have regular check-up  appointments with your healthcare provider that is prescribing your Eliquis.  In the future your dose may need to be changed if your kidney function or weight changes by a significant amount or as you get older.  What do you do if you miss a dose? If you miss a dose, take it as soon as you remember on the same day and resume taking twice daily.  Do not take more than one dose of ELIQUIS at the same time to make up a missed dose.  Important Safety Information A possible side effect of Eliquis is bleeding. You should call your healthcare provider right away if you experience any of the following: ? Bleeding from an injury or your nose that does not stop. ? Unusual colored urine (red or dark brown) or unusual colored stools (red or black). ? Unusual bruising for unknown  reasons. ? A serious fall or if you hit your head (even if there is no bleeding).  Some medicines may interact with Eliquis and might increase your risk of bleeding or clotting while on Eliquis. To help avoid this, consult your healthcare provider or pharmacist prior to using any new prescription or non-prescription medications, including herbals, vitamins, non-steroidal anti-inflammatory drugs (NSAIDs) and supplements.  This website has more information on Eliquis (apixaban): http://www.eliquis.com/eliquis/home

## 2016-03-07 NOTE — Discharge Summary (Signed)
[name], [name]                ACCOUNT NO.:  [id number]  MEDICAL RECORD NO.:  [id number]  LOCATION:  [id number]                        FACILITY:  [facility]  PHYSICIAN:  Mykela Mewborn N. Terrence Dupont, M.D. DATE OF BIRTH:  [birth year]  DATE OF ADMISSION:  03/06/2016 DATE OF DISCHARGE:  03/07/2016                              DISCHARGE SUMMARY   ADMITTING DIAGNOSES: 1. Unstable angina, rule out myocardial infarction. 2. Coronary artery disease status post CABG in 1983, subsequently had     PTCA stenting to protected left main and left circumflex, and     saphenous vein graft to LAD in February, 2016. 3. Hypertension. 4. Hyperlipidemia. 5. Chronic atrial fibrillation, CHADS-VASc score of 5. 6. Chronic obstructive pulmonary disease, remote tobacco abuse. 7. Chronic kidney disease, stage 3.  History of kidney stones in the     past. 8. Degenerative joint disease. 9. History of pancreatitis in the past. 10.Mitral regurgitation.  DISCHARGE DIAGNOSES: 1. Stable angina, myocardial infarction ruled out. 2. Coronary artery disease status post CABG in 1983, subsequently had     PTCA stenting to protected left main/left circumflex, and saphenous     vein graft to LAD in February of 2016. 3. Hypertension. 4. Hyperlipidemia. 5. Chronic atrial fibrillation, CHADS-VASc score of 5. 6. Chronic obstructive pulmonary disease. 7. Remote tobacco abuse. 8. Chronic kidney disease, stage 3. 9. History of kidney stones in the past. 10.Degenerative joint disease. 11.History of pancreatitis in the past. 12.Mitral regurgitation.  DISCHARGE HOME MEDICATIONS: 1. Amlodipine 2.5 mg 1 tablet daily. 2. Apixaban 2.5 mg twice daily. 3. Atorvastatin 80 mg 1 tablet daily. 4. Nitrostat sublingual p.r.n. 5. Omeprazole 20 mg daily. 6. Clopidogrel 37.5 mg daily. 7. Lasix 20 mg daily. 8. The patient has been advised to stop aspirin and lisinopril for     now.  DIET:  Low salt, low cholesterol.  ACTIVITY:  Increase activity  as tolerated with assistance.  FOLLOWUP:  Follow up with me in 1 week.  CONDITION AT DISCHARGE:  Stable.  BRIEF HISTORY AND HOSPITAL COURSE:  [name] is an 82 year old male with past medical history significant for coronary artery disease status post CABG in 1983 status post PTCA stenting to protected left main/left circumflex and saphenous vein graft to LAD in February of 2016, hypertension, chronic atrial fibrillation refused anticoagulation in the past, COPD, degenerative joint disease, history of abdominal aortic aneurysm status post repair in the past, remote tobacco abuse, chronic kidney disease stage 3.  He came to the ER complaining of recurrent retrosternal chest pain off and on radiating to left shoulder for last 7- 10 days.  States last night, he woke up with chest pain lasting few minutes.  Used 1 sublingual nitro with relief.  Again this morning had similar pain requiring 1 sublingual nitro with relief of chest pain. The patient presently denies any chest pain.  EKG done in the ER showed AFib with controlled ventricular response.  Old inferior wall MI.  Right bundle-branch block.  No new acute ischemic changes were noted.  First set of cardiac enzyme was negative.  The patient denies any nausea, vomiting, diaphoresis.  Denies PND or orthopnea, but complains of mild leg swelling.  Denies  any shortness of breath.  PHYSICAL EXAMINATION:  GENERAL: he was alert, awake, oriented x3. VITAL SIGNS:  Blood pressure was 144/91, pulse 57, irregularly irregular. HEENT:  Conjunctivae pink. NECK:  Supple.  No JVD.  No bruit. LUNGS:  Clear to auscultation without rhonchi or rales. CARDIOVASCULAR:  S1, S2 was soft.  There was 2/6 systolic murmur at the apex and soft diastolic murmur at left lower sternal border. ABDOMEN:  Soft.  Bowel sounds present.  Nontender. EXTREMITIES:  There is no clubbing, cyanosis.  There was 1+ edema.  LABORATORY DATA:  Sodium was 139, potassium 4.2, BUN  39, creatinine 1.66.  His troponin was 0.04, 0.05, 0.05.  Cholesterol was 112, triglycerides 44, HDL was low at 32, LDL 71.  Hemoglobin was 12.1, hematocrit 39.4, white count of 5.4.  BRIEF HOSPITAL COURSE:  The patient was admitted to telemetry unit.  MI was ruled out by serial enzymes and EKG.  The patient did not have any further episodes of chest pain during the hospital stay.  Discussed with the patient and family at length regarding chronic anticoagulation as the patient has multiple risk factors and has chronic AFib.  Finally, the patient agreed for anticoagulation.  We will stop his aspirin and cut down the dose of clopidogrel to half tablet daily and start on apaxiban 2.5 mg twice daily.  The patient was discussed regarding further workup which he refused, wanted to be treated medically only.  Also discussed with the patient and family at length regarding skilled nursing facility, but states he wanted to go home and has good family support. The patient will be discharged home on above medications and will be followed up in my office in 1 week.     [name]. Terrence Dupont, M.D.     MNH/MEDQ  D:  03/07/2016  T:  03/07/2016  Job:  [id number]

## 2016-03-07 NOTE — Progress Notes (Signed)
[facility] for heparin Indication: chest pain/ACS  Allergies  Allergen Reactions  . Ether     REACTION: Reaction not known  . Isosorbide Mononitrate     REACTION: Blurred vision,and lightheadedness.    Patient Measurements: Height: 6\' 1"  (185.4 cm) Weight: 202 lb 13.2 oz (92 kg) IBW/kg (Calculated) : 79.9 Heparin Dosing Weight: 90.7kg  Vital Signs: BP: 111/62 mmHg (05/17 0614) Pulse Rate: 56 (05/17 0014)  Labs:  Recent Labs  03/06/16 1144 03/06/16 1558 03/06/16 2056 03/06/16 2242 03/07/16 0318 03/07/16 0319 03/07/16 0322  HGB 12.1*  --   --   --  12.1*  --   --   HCT 39.4  --   --   --  38.4*  --   --   PLT 142*  --   --   --  159  --   --   HEPARINUNFRC  --   --   --  0.51  --  0.46  --   CREATININE 1.66*  --   --   --  1.68*  --   --   TROPONINI  --  0.04* 0.05*  --   --   --  0.05*    Estimated Creatinine Clearance: 34.3 mL/min (by C-G formula based on Cr of 1.68).  Assessment: 55 yom with CP. Pharmacy consulted to dose heparin for ACS. No AC pta. Heparin level remains therapeutic and CBC is stable. No bleeding noted.   Goal of Therapy:  Heparin level 0.3-0.7 units/ml Monitor platelets by anticoagulation protocol: Yes   Plan:  - Continue heparin gtt 1200 units/hr - Daily heparin level and CBC  Salome Arnt, PharmD, BCPS Pager # [phone] 03/07/2016 10:39 AM

## 2016-03-07 NOTE — Discharge Summary (Signed)
Discharge summary dictated on 03/07/2016, dictation number is [phone]

## 2016-03-07 NOTE — Progress Notes (Signed)
Tele called and stated that pt was in a 3rd degree heart block. Upon inspection Pt's rhythm looked more like Afib. Pt is asymptomatic and resting comfortably. MD notified.

## 2016-04-26 ENCOUNTER — Inpatient Hospital Stay (HOSPITAL_COMMUNITY)
Admission: EM | Admit: 2016-04-26 | Discharge: 2016-05-01 | DRG: 291 | Disposition: A | Discharge status: Home Health | Payer: Medicare Other | Attending: Cardiology | Admitting: Cardiology

## 2016-04-26 ENCOUNTER — Encounter (HOSPITAL_COMMUNITY): Payer: Self-pay | Admitting: Emergency Medicine

## 2016-04-26 ENCOUNTER — Emergency Department (HOSPITAL_COMMUNITY): Payer: Medicare Other

## 2016-04-26 DIAGNOSIS — Z87891 Personal history of nicotine dependence: Secondary | ICD-10-CM

## 2016-04-26 DIAGNOSIS — I252 Old myocardial infarction: Secondary | ICD-10-CM | POA: Diagnosis not present

## 2016-04-26 DIAGNOSIS — N183 Chronic kidney disease, stage 3 (moderate): Secondary | ICD-10-CM | POA: Diagnosis present

## 2016-04-26 DIAGNOSIS — I130 Hypertensive heart and chronic kidney disease with heart failure and stage 1 through stage 4 chronic kidney disease, or unspecified chronic kidney disease: Secondary | ICD-10-CM | POA: Diagnosis present

## 2016-04-26 DIAGNOSIS — I5023 Acute on chronic systolic (congestive) heart failure: Secondary | ICD-10-CM | POA: Diagnosis present

## 2016-04-26 DIAGNOSIS — I340 Nonrheumatic mitral (valve) insufficiency: Secondary | ICD-10-CM | POA: Diagnosis present

## 2016-04-26 DIAGNOSIS — K219 Gastro-esophageal reflux disease without esophagitis: Secondary | ICD-10-CM | POA: Diagnosis present

## 2016-04-26 DIAGNOSIS — Z951 Presence of aortocoronary bypass graft: Secondary | ICD-10-CM | POA: Diagnosis not present

## 2016-04-26 DIAGNOSIS — J449 Chronic obstructive pulmonary disease, unspecified: Secondary | ICD-10-CM | POA: Diagnosis present

## 2016-04-26 DIAGNOSIS — I4510 Unspecified right bundle-branch block: Secondary | ICD-10-CM | POA: Diagnosis present

## 2016-04-26 DIAGNOSIS — I482 Chronic atrial fibrillation: Secondary | ICD-10-CM | POA: Diagnosis present

## 2016-04-26 DIAGNOSIS — I509 Heart failure, unspecified: Secondary | ICD-10-CM

## 2016-04-26 DIAGNOSIS — M1990 Unspecified osteoarthritis, unspecified site: Secondary | ICD-10-CM | POA: Diagnosis present

## 2016-04-26 DIAGNOSIS — Z7901 Long term (current) use of anticoagulants: Secondary | ICD-10-CM

## 2016-04-26 DIAGNOSIS — Z79899 Other long term (current) drug therapy: Secondary | ICD-10-CM | POA: Diagnosis not present

## 2016-04-26 DIAGNOSIS — R0602 Shortness of breath: Secondary | ICD-10-CM | POA: Diagnosis not present

## 2016-04-26 DIAGNOSIS — Z955 Presence of coronary angioplasty implant and graft: Secondary | ICD-10-CM | POA: Diagnosis not present

## 2016-04-26 DIAGNOSIS — E785 Hyperlipidemia, unspecified: Secondary | ICD-10-CM | POA: Diagnosis present

## 2016-04-26 DIAGNOSIS — Z96653 Presence of artificial knee joint, bilateral: Secondary | ICD-10-CM | POA: Diagnosis present

## 2016-04-26 DIAGNOSIS — I25110 Atherosclerotic heart disease of native coronary artery with unstable angina pectoris: Secondary | ICD-10-CM | POA: Diagnosis present

## 2016-04-26 DIAGNOSIS — I200 Unstable angina: Secondary | ICD-10-CM | POA: Diagnosis present

## 2016-04-26 HISTORY — DX: Foot drop, right foot: M21.371

## 2016-04-26 HISTORY — DX: Dorsalgia, unspecified: M54.9

## 2016-04-26 HISTORY — DX: Insomnia, unspecified: G47.00

## 2016-04-26 HISTORY — DX: Other chronic pain: G89.29

## 2016-04-26 HISTORY — DX: Pure hypercholesterolemia, unspecified: E78.00

## 2016-04-26 HISTORY — DX: Reserved for inherently not codable concepts without codable children: IMO0001

## 2016-04-26 LAB — TROPONIN I
Troponin I: 0.03 ng/mL (ref <0.03)
Troponin I: 0.04 ng/mL (ref <0.03)

## 2016-04-26 LAB — BASIC METABOLIC PANEL
Anion gap: 9 (ref 5–15)
BUN: 43 mg/dL — ABNORMAL HIGH (ref 6–20)
CALCIUM: 8.8 mg/dL — AB (ref 8.9–10.3)
CO2: 24 mmol/L (ref 22–32)
CREATININE: 1.82 mg/dL — AB (ref 0.61–1.24)
Chloride: 106 mmol/L (ref 101–111)
GFR calc non Af Amer: 32 mL/min — ABNORMAL LOW (ref >60)
GFR, EST AFRICAN AMERICAN: 37 mL/min — AB (ref >60)
Glucose, Bld: 125 mg/dL — ABNORMAL HIGH (ref 65–99)
Potassium: 4.3 mmol/L (ref 3.5–5.1)
Sodium: 139 mmol/L (ref 135–145)

## 2016-04-26 LAB — CBC
HCT: 38.1 % — ABNORMAL LOW (ref 39.0–52.0)
Hemoglobin: 11.8 g/dL — ABNORMAL LOW (ref 13.0–17.0)
MCH: 29.2 pg (ref 26.0–34.0)
MCHC: 31 g/dL (ref 30.0–36.0)
MCV: 94.3 fL (ref 78.0–100.0)
PLATELETS: 175 10*3/uL (ref 150–400)
RBC: 4.04 MIL/uL — AB (ref 4.22–5.81)
RDW: 15 % (ref 11.5–15.5)
WBC: 5.3 10*3/uL (ref 4.0–10.5)

## 2016-04-26 LAB — I-STAT TROPONIN, ED: TROPONIN I, POC: 0.01 ng/mL (ref 0.00–0.08)

## 2016-04-26 LAB — BRAIN NATRIURETIC PEPTIDE: B Natriuretic Peptide: 451.6 pg/mL — ABNORMAL HIGH (ref 0.0–100.0)

## 2016-04-26 MED ORDER — SODIUM CHLORIDE 0.9 % IV SOLN: 250.0000 mL | INTRAVENOUS | Status: DC | PRN

## 2016-04-26 MED ORDER — CLOPIDOGREL BISULFATE 75 MG PO TABS
37.5000 mg | ORAL_TABLET | Freq: Every day | ORAL | Status: DC
  Administered 2016-04-27 – 2016-05-01 (×5): 37.5 mg via ORAL
  Filled 2016-04-26 (×5): qty 1

## 2016-04-26 MED ORDER — ATORVASTATIN CALCIUM 40 MG PO TABS
40.0000 mg | ORAL_TABLET | Freq: Every day | ORAL | Status: DC
  Administered 2016-04-26 – 2016-04-30 (×5): 40 mg via ORAL
  Filled 2016-04-26 (×5): qty 1

## 2016-04-26 MED ORDER — NITROGLYCERIN 2 % TD OINT
0.5000 [in_us] | TOPICAL_OINTMENT | Freq: Three times a day (TID) | TRANSDERMAL | Status: DC
Start: 2016-04-26 — End: 2016-04-27
  Administered 2016-04-26 – 2016-04-27 (×3): 0.5 [in_us] via TOPICAL
  Filled 2016-04-26: qty 30

## 2016-04-26 MED ORDER — FUROSEMIDE 10 MG/ML IJ SOLN
40.0000 mg | Freq: Every day | INTRAMUSCULAR | Status: DC
  Administered 2016-04-27 – 2016-04-30 (×4): 40 mg via INTRAVENOUS
  Filled 2016-04-26 (×4): qty 4

## 2016-04-26 MED ORDER — ONDANSETRON HCL 4 MG/2ML IJ SOLN: 4.0000 mg | Freq: Four times a day (QID) | INTRAMUSCULAR | Status: DC | PRN

## 2016-04-26 MED ORDER — APIXABAN 2.5 MG PO TABS
2.5000 mg | ORAL_TABLET | Freq: Two times a day (BID) | ORAL | Status: DC
  Administered 2016-04-26 – 2016-05-01 (×10): 2.5 mg via ORAL
  Filled 2016-04-26 (×10): qty 1

## 2016-04-26 MED ORDER — NITROGLYCERIN 0.4 MG SL SUBL: 0.4000 mg | SUBLINGUAL_TABLET | SUBLINGUAL | Status: DC | PRN

## 2016-04-26 MED ORDER — ACETAMINOPHEN 325 MG PO TABS
650.0000 mg | ORAL_TABLET | Freq: Two times a day (BID) | ORAL | Status: DC
  Administered 2016-04-26 – 2016-05-01 (×10): 650 mg via ORAL
  Filled 2016-04-26 (×10): qty 2

## 2016-04-26 MED ORDER — HYPROMELLOSE (GONIOSCOPIC) 2.5 % OP SOLN
1.0000 [drp] | Freq: Every day | OPHTHALMIC | Status: DC
  Administered 2016-04-26 – 2016-04-30 (×5): 1 [drp] via OPHTHALMIC
  Filled 2016-04-26: qty 15

## 2016-04-26 MED ORDER — FUROSEMIDE 10 MG/ML IJ SOLN
40.0000 mg | Freq: Once | INTRAMUSCULAR | Status: AC
  Administered 2016-04-26: 40 mg via INTRAVENOUS
  Filled 2016-04-26: qty 4

## 2016-04-26 MED ORDER — ACETAMINOPHEN 325 MG PO TABS: 650.0000 mg | ORAL_TABLET | ORAL | Status: DC | PRN

## 2016-04-26 MED ORDER — PANTOPRAZOLE SODIUM 40 MG PO TBEC
40.0000 mg | DELAYED_RELEASE_TABLET | Freq: Every day | ORAL | Status: DC
  Administered 2016-04-27 – 2016-05-01 (×5): 40 mg via ORAL
  Filled 2016-04-26 (×5): qty 1

## 2016-04-26 MED ORDER — SODIUM CHLORIDE 0.9% FLUSH
3.0000 mL | Freq: Two times a day (BID) | INTRAVENOUS | Status: DC
  Administered 2016-04-26 – 2016-04-30 (×9): 3 mL via INTRAVENOUS

## 2016-04-26 MED ORDER — SODIUM CHLORIDE 0.9% FLUSH: 3.0000 mL | INTRAVENOUS | Status: DC | PRN

## 2016-04-26 NOTE — H&P (Signed)
Gregg Hodges is an 82 y.o. male.   Chief Complaint: Progressive increasing shortness of breath associated with Leg swelling and occasional chest pain HPI: Patient is 82 year old male with past medical history significant for coronary artery disease status post CABG in 1983 approximately 34 years ago status post PTCA stenting to protected left main/left circumflex and saphenous vein graft to LAD in February 2016, hypertension, chronic atrial fibrillation on chronic anticoagulation, COPD, degenerative joint disease, history of abdominal aortic aneurysm status post repair in the past, remote tobacco abuse, chronic kidney disease stage III, history of congestive heart failure secondary to depressed LV systolic function, valvular heart disease, came to the ER complaining of progressive increasing shortness of breath associated with leg swelling for last few days patient also gives history of PND orthopnea. Also complains of retrosternal chest pain off and on requiring occasional sublingual nitroglycerin states had chest pain earlier this morning took 1 sublingual nitroglycerin with relief of chest pain. Denies any excessive salty food intake denies any cough fever or chills. Denies any noncompliance to medication. EKG done in the ER showed the A. fib with controlled ventricular response old inferior wall MI and right bundle branch block no new acute ischemic changes were noted. States recently his [facility] to 40 mg twice daily.  Past Medical History  Diagnosis Date  . Arthritis   . Cancer ([place])     Malignant adnoid carcinoma (roof of mouth)  . CHF (congestive heart failure) ([place])   . Coronary artery disease   . Hypertension   . Renal disorder     kidney stones, resulted in blockage of L kidney, later removed due to damage  . Complication of anesthesia     only to ether  . Myocardial infarction ([place]) 11/2014  . Anginal pain ([place])   . Heart murmur   . GERD (gastroesophageal reflux  disease)   . CKD (chronic kidney disease), stage III     Past Surgical History  Procedure Laterality Date  . Cardiac surgery      CABG  . Cholecystectomy    . Esophagogastroduodenoscopy    . Abdominal aortic aneurysm repair    . Ercp N/A 02/25/2013    Procedure: ENDOSCOPIC RETROGRADE CHOLANGIOPANCREATOGRAPHY (ERCP);  Surgeon: Jeryl Columbia, MD;  Location: [facility];  Service: Endoscopy;  Laterality: N/A;  . Left heart catheterization with coronary/graft angiogram N/A 12/02/2014    Procedure: LEFT HEART CATHETERIZATION WITH CORONARY/GRAFT ANGIOGRAM;  Surgeon: Clent Demark, MD;  [facility];  Service: Cardiovascular;  Laterality: N/A;  . Coronary artery bypass graft    . Total knee arthroplasty Bilateral   . Hernia repair      No family history on file. Social History:  reports that he quit smoking about 49 years ago. His smoking use included Cigarettes. He has never used smokeless tobacco. He reports that he does not drink alcohol or use illicit drugs.  Allergies:  Allergies  Allergen Reactions  . Aspirin Other (See Comments)    Cant have due to blood thinner  . Ether     REACTION: Reaction not known  . Isosorbide Mononitrate     REACTION: Blurred vision,and lightheadedness.     (Not in a hospital admission)  Results for orders placed or performed during the hospital encounter of 04/26/16 (from the past 48 hour(s))  Basic metabolic panel     Status: Abnormal   Collection Time: 04/26/16  9:30 AM  Result Value Ref Range  Sodium 139 135 - 145 mmol/L   Potassium 4.3 3.5 - 5.1 mmol/L   Chloride 106 101 - 111 mmol/L   CO2 24 22 - 32 mmol/L   Glucose, Bld 125 (H) 65 - 99 mg/dL   BUN 43 (H) 6 - 20 mg/dL   Creatinine, Ser 1.82 (H) 0.61 - 1.24 mg/dL   Calcium 8.8 (L) 8.9 - 10.3 mg/dL   GFR calc non Af Amer 32 (L) >60 mL/min   GFR calc Af Amer 37 (L) >60 mL/min    Comment: (NOTE) The eGFR has been calculated using the CKD EPI equation. This calculation has not been  validated in all clinical situations. eGFR's persistently <60 mL/min signify possible Chronic Kidney Disease.    Anion gap 9 5 - 15  CBC     Status: Abnormal   Collection Time: 04/26/16  9:30 AM  Result Value Ref Range   WBC 5.3 4.0 - 10.5 K/uL   RBC 4.04 (L) 4.22 - 5.81 MIL/uL   Hemoglobin 11.8 (L) 13.0 - 17.0 g/dL   HCT 38.1 (L) 39.0 - 52.0 %   MCV 94.3 78.0 - 100.0 fL   MCH 29.2 26.0 - 34.0 pg   MCHC 31.0 30.0 - 36.0 g/dL   RDW 15.0 11.5 - 15.5 %   Platelets 175 150 - 400 K/uL  Brain natriuretic peptide     Status: Abnormal   Collection Time: 04/26/16  9:30 AM  Result Value Ref Range   B Natriuretic Peptide 451.6 (H) 0.0 - 100.0 pg/mL  I-stat troponin, ED     Status: None   Collection Time: 04/26/16  9:43 AM  Result Value Ref Range   Troponin i, poc 0.01 0.00 - 0.08 ng/mL   Comment 3            Comment: Due to the release kinetics of cTnI, a negative result within the first hours of the onset of symptoms does not rule out myocardial infarction with certainty. If myocardial infarction is still suspected, repeat the test at appropriate intervals.    Dg Chest Port 1 View  04/26/2016  CLINICAL DATA:  Shortness of breath. EXAM: PORTABLE CHEST 1 VIEW COMPARISON:  05/13/2015. FINDINGS: Prior CABG. Cardiomegaly with pulmonary vascular prominence and bilateral mild interstitial prominence suggesting mild congestive heart failure. No prominent pleural effusion. Cardiophrenic angles not imaged. No pneumothorax. IMPRESSION: Prior CABG. Cardiomegaly with mild pulmonary vascular prominence interstitial prominence suggesting mild congestive heart failure. Electronically Signed   By: Marcello Moores  Register   On: 04/26/2016 09:46    Review of Systems  Constitutional: Negative for fever and chills.  Eyes: Negative for double vision.  Respiratory: Positive for shortness of breath. Negative for hemoptysis.   Cardiovascular: Positive for chest pain, orthopnea, leg swelling and PND. Negative for  palpitations.  Gastrointestinal: Negative for nausea, vomiting and abdominal pain.  Genitourinary: Negative for dysuria.  Neurological: Negative for dizziness and headaches.    Blood pressure 128/65, pulse 49, temperature 97.7 F (36.5 C), temperature source Oral, resp. rate 23, SpO2 100 %. Physical Exam  Constitutional: He is oriented to person, place, and time.  HENT:  Head: Normocephalic and atraumatic.  Eyes: Conjunctivae are normal. Left eye exhibits no discharge. No scleral icterus.  Neck: Normal range of motion. Neck supple. JVD present. No tracheal deviation present. No thyromegaly present.  Cardiovascular:  Irregularly irregular S1 and S2 soft there is 2/6 systolic murmur and S3 gallop noted  Respiratory:  Decreased breath sound at bases with bibasilar Rales noted  GI: Soft. Bowel sounds are normal. He exhibits no distension. There is no tenderness. There is no rebound.  Musculoskeletal:  No clubbing cyanosis 3+ edema noted  Neurological: He is alert and oriented to person, place, and time.     Assessment/Plan Acute decompensated systolic congestive heart failure Unstable angina rule out MI Coronary artery disease status post CABG 1983 subsequently had PCI to protected left main/left circumflex and stiffness when graft to LAD in February 2016 Mitral regurgitation Hypertension Hyperlipidemia Chronic A. fib chadsvasc score of 5 COPD Remote tobacco abuse Chronic kidney disease stage III History of kidney stones in the past Degenerative joint disease History of pancreatitis in the past Plan As per orders   Charolette Forward, MD 04/26/2016, 11:30 AM

## 2016-04-26 NOTE — Progress Notes (Signed)
Patient's HR dropping into the high 30's while patient is sleeping. HR is in the 50s while patient is awake. Patient's rhythm is atrial flutter. Will continue to monitor.

## 2016-04-26 NOTE — ED Notes (Addendum)
Pt in from home with c/o sob since last HS. Pt denies CP currently, but had 7/10 tightness earlier and took 1 NTG PTA. Pt has hx of CHF, CABG in 35', 16' MI with stent placement (cardiologist is Dr. Terrence Dupont). Pt reports being sob all night, unable to sleep, had to sit up. Pt takes 80mg  Lasix daily, took 40mg  today. Alert, oriented, walks and is exerted, able to speak in full sentences.

## 2016-04-26 NOTE — ED Provider Notes (Signed)
CSN: [id number]     Arrival date & time 04/26/16  0912 History   First MD Initiated Contact with Patient 04/26/16 0915     Chief Complaint  Patient presents with  . Shortness of Breath  . Leg Swelling  . Chest Pain     Patient is a 82 y.o. male presenting with shortness of breath. The history is provided by the patient and a relative.  Shortness of Breath Severity:  Moderate Onset quality:  Gradual Duration: several weeks. Timing:  Intermittent Progression:  Worsening Chronicity:  New Relieved by:  Rest Worsened by:  Exertion Associated symptoms: chest pain   Associated symptoms: no fever, no hemoptysis and no syncope   Patient with h/o CHF who presents with increasing SOB/LE edema for past several weeks, with acute worsening in past 24 hours He did have some CP earlier today that improved with NTG This feels similar to prior episodes of CHF   Past Medical History  Diagnosis Date  . Arthritis   . Cancer ([place])     Malignant adnoid carcinoma (roof of mouth)  . CHF (congestive heart failure) ([place])   . Coronary artery disease   . Hypertension   . Renal disorder     kidney stones, resulted in blockage of L kidney, later removed due to damage  . Complication of anesthesia     only to ether  . Myocardial infarction ([place]) 11/2014  . Anginal pain ([place])   . Heart murmur   . GERD (gastroesophageal reflux disease)   . CKD (chronic kidney disease), stage III    Past Surgical History  Procedure Laterality Date  . Cardiac surgery      CABG  . Cholecystectomy    . Esophagogastroduodenoscopy    . Abdominal aortic aneurysm repair    . Ercp N/A 02/25/2013    Procedure: ENDOSCOPIC RETROGRADE CHOLANGIOPANCREATOGRAPHY (ERCP);  Surgeon: Jeryl Columbia, MD;  Location: [facility];  Service: Endoscopy;  Laterality: N/A;  . Left heart catheterization with coronary/graft angiogram N/A 12/02/2014    Procedure: LEFT HEART CATHETERIZATION WITH CORONARY/GRAFT ANGIOGRAM;  Surgeon: Clent Demark, MD;   Location: [facility] CATH LAB;  Service: Cardiovascular;  Laterality: N/A;  . Coronary artery bypass graft    . Total knee arthroplasty Bilateral   . Hernia repair     No family history on file. Social History  Substance Use Topics  . Smoking status: Former Smoker    Types: Cigarettes    Quit date: 02/24/1967  . Smokeless tobacco: Never Used  . Alcohol Use: No    Review of Systems  Constitutional: Negative for fever.  Respiratory: Positive for shortness of breath. Negative for hemoptysis.   Cardiovascular: Positive for chest pain. Negative for syncope.  Neurological: Negative for syncope.  All other systems reviewed and are negative.     Allergies  Ether and Isosorbide mononitrate  Home Medications   Prior to Admission medications   Medication Sig Start Date End Date Taking? Authorizing Provider  amLODipine (NORVASC) 2.5 MG tablet Take 1 tablet (2.5 mg total) by mouth daily. 03/07/16   Charolette Forward, MD  apixaban (ELIQUIS) 2.5 MG TABS tablet Take 1 tablet (2.5 mg total) by mouth 2 (two) times daily. 03/07/16   Charolette Forward, MD  atorvastatin (LIPITOR) 80 MG tablet Take 1 tablet (80 mg total) by mouth daily at 6 PM. 12/05/14   Dixie Dials, MD  clopidogrel (PLAVIX) 75 MG tablet Take 0.5 tablets (37.5 mg total) by mouth daily with breakfast. 03/07/16  Charolette Forward, MD  furosemide (LASIX) 20 MG tablet Take 1 tablet (20 mg total) by mouth daily. Patient taking differently: Take 40-60 mg by mouth daily. Pt takes 40 mg every day and can take an extra 20 mg if needed 05/13/15   Nat Christen, MD  nitroGLYCERIN (NITROSTAT) 0.4 MG SL tablet Place 0.4 mg under the tongue every 5 (five) minutes as needed for chest pain.    Historical Provider, MD  omeprazole (PRILOSEC) 20 MG capsule Take 20 mg by mouth daily.    Historical Provider, MD   BP 126/75 mmHg  Pulse 51  Temp(Src) 97.7 F (36.5 C) (Oral)  SpO2 98% Physical Exam CONSTITUTIONAL: Well developed/well nourished HEAD:  Normocephalic/atraumatic EYES: EOMI/PERRL ENMT: Mucous membranes moist NECK: supple no meningeal signs, +JVD SPINE/BACK:entire spine nontender CV: S1/S2 noted, no murmurs/rubs/gallops noted LUNGS: decreased BS noted bilaterally, no apparent distress ABDOMEN: soft, nontender, no rebound or guarding, bowel sounds noted throughout abdomen GU:no cva tenderness NEURO: Pt is awake/alert/appropriate, moves all extremitiesx4.  No facial droop.   EXTREMITIES: pulses normal/equal, full ROM, + symmetric pitting edema to bilateral LE SKIN: warm, color normal PSYCH: no abnormalities of mood noted, alert and oriented to situation  ED Course  Procedures  Labs Review Labs Reviewed  BASIC METABOLIC PANEL - Abnormal; Notable for the following:    Glucose, Bld 125 (*)    BUN 43 (*)    Creatinine, Ser 1.82 (*)    Calcium 8.8 (*)    GFR calc non Af Amer 32 (*)    GFR calc Af Amer 37 (*)    All other components within normal limits  CBC - Abnormal; Notable for the following:    RBC 4.04 (*)    Hemoglobin 11.8 (*)    HCT 38.1 (*)    All other components within normal limits  BRAIN NATRIURETIC PEPTIDE - Abnormal; Notable for the following:    B Natriuretic Peptide 451.6 (*)    All other components within normal limits  Randolm Idol, ED    Imaging Review Dg Chest Port 1 View  04/26/2016  CLINICAL DATA:  Shortness of breath. EXAM: PORTABLE CHEST 1 VIEW COMPARISON:  05/13/2015. FINDINGS: Prior CABG. Cardiomegaly with pulmonary vascular prominence and bilateral mild interstitial prominence suggesting mild congestive heart failure. No prominent pleural effusion. Cardiophrenic angles not imaged. No pneumothorax. IMPRESSION: Prior CABG. Cardiomegaly with mild pulmonary vascular prominence interstitial prominence suggesting mild congestive heart failure. Electronically Signed   By: Marcello Moores  Register   On: 04/26/2016 09:46   I have personally reviewed and evaluated these images and lab results as part of  my medical decision-making.  ED ECG REPORT   Date: 04/26/2016 0925am  Rate: 54  Rhythm: indeterminate  QRS Axis: left  Intervals: normal  ST/T Wave abnormalities: nonspecific ST changes  Conduction Disutrbances:right bundle branch block  Narrative Interpretation:   Old EKG Reviewed: unchanged  I have personally reviewed the EKG tracing and agree with the computerized printout as noted.  Medications  furosemide (LASIX) injection 40 mg (not administered)   10:38 AM Pt with h/o CHF here with exacerbation He reports he is very symptomatic at home even with daily activities Will admit D/w dr Terrence Dupont for admission to TELE Pt stable in the ED at this time MDM   Final diagnoses:  Acute congestive heart failure, unspecified congestive heart failure type [facility])    Nursing notes including past medical history and social history reviewed and considered in documentation xrays/imaging reviewed by myself and considered during evaluation  Labs/vital reviewed myself and considered during evaluation     Ripley Fraise, MD 04/26/16 1039

## 2016-04-27 LAB — BASIC METABOLIC PANEL
ANION GAP: 7 (ref 5–15)
BUN: 42 mg/dL — AB (ref 6–20)
CHLORIDE: 105 mmol/L (ref 101–111)
CO2: 28 mmol/L (ref 22–32)
Calcium: 8.4 mg/dL — ABNORMAL LOW (ref 8.9–10.3)
Creatinine, Ser: 1.79 mg/dL — ABNORMAL HIGH (ref 0.61–1.24)
GFR calc Af Amer: 37 mL/min — ABNORMAL LOW (ref >60)
GFR calc non Af Amer: 32 mL/min — ABNORMAL LOW (ref >60)
GLUCOSE: 90 mg/dL (ref 65–99)
POTASSIUM: 3.7 mmol/L (ref 3.5–5.1)
SODIUM: 140 mmol/L (ref 135–145)

## 2016-04-27 LAB — TROPONIN I: TROPONIN I: 0.04 ng/mL — AB (ref <0.03)

## 2016-04-27 MED ORDER — ISOSORB DINITRATE-HYDRALAZINE 20-37.5 MG PO TABS
0.5000 | ORAL_TABLET | Freq: Two times a day (BID) | ORAL | Status: DC
  Administered 2016-04-27 – 2016-04-28 (×3): 0.5 via ORAL
  Filled 2016-04-27 (×3): qty 1

## 2016-04-27 NOTE — Evaluation (Signed)
Physical Therapy Evaluation Patient Details Name: Gregg Hodges MRN: [id number] DOB: 04-12-1926 Today's Date: 04/27/2016   History of Present Illness  Pt is an 82 y/o male presented with shortness of breath associated with leg swelling for last few days patient also gives history of PND orthopnea. Also complains of retrosternal chest pain off and on. Pt admitted wtih CHF  Clinical Impression  Pt presents mod I with SPC for all gait and mobility and transfers. Pt with slight LOB with turning and direction changes, PT encouraged pt to use RW to improve stability and reduce risk of falls. Pt agrees, stating, "with that walker, I can go anywhere".  Pt with no further acute care PT needs at this time.    Follow Up Recommendations No PT follow up    Equipment Recommendations  None recommended by PT    Recommendations for Other Services       Precautions / Restrictions Precautions Precautions: Fall Restrictions Weight Bearing Restrictions: No      Mobility  Bed Mobility Overal bed mobility: Independent                Transfers Overall transfer level: Modified independent Equipment used: Straight cane             General transfer comment: no LOB with sit to stand or SPT  Ambulation/Gait Ambulation/Gait assistance: Modified independent (Device/Increase time) Ambulation Distance (Feet): 150 Feet Assistive device: Straight cane       General Gait Details: pt with increased lateral sway with gait, slight LOB with turns, able to self correct with SPC. PT encouraged pt to use rollator for greater stability and to reduce risk of falls  Stairs            Wheelchair Mobility    Modified Rankin (Stroke Patients Only)       Balance                                             Pertinent Vitals/Pain Pain Assessment: No/denies pain    Home Living Family/patient expects to be discharged to:: Private residence Living Arrangements:  Alone Available Help at Discharge: Family;Available PRN/intermittently Type of Home: House Home Access: Stairs to enter Entrance Stairs-Rails: Left Entrance Stairs-Number of Steps: 2 Home Layout: One level Home Equipment: Walker - 4 wheels;Cane - single point Additional Comments: pt's family looking into independent or assisted living facilities    Prior Function Level of Independence: Independent with assistive device(s)         Comments: uses cane in house, rollator in community     Hand Dominance        Extremity/Trunk Assessment               Lower Extremity Assessment: Overall WFL for tasks assessed      Cervical / Trunk Assessment: Kyphotic  Communication   Communication: No difficulties  Cognition Arousal/Alertness: Awake/alert Behavior During Therapy: WFL for tasks assessed/performed                        General Comments      Exercises        Assessment/Plan    PT Assessment Patent does not need any further PT services  PT Diagnosis     PT Problem List    PT Treatment Interventions     PT Goals (Current goals  can be found in the Care Plan section) Acute Rehab PT Goals PT Goal Formulation: All assessment and education complete, DC therapy    Frequency     Barriers to discharge        Co-evaluation               End of Session Equipment Utilized During Treatment: Gait belt Activity Tolerance: Patient tolerated treatment well Patient left: in bed;with call bell/phone within reach;with family/visitor present Nurse Communication: Mobility status (pt will benefit from walking daily)         Time: 1325-1340 PT Time Calculation (min) (ACUTE ONLY): 15 min   Charges:   PT Evaluation $PT Eval Moderate Complexity: 1 Procedure     PT G Codes:        Gregg Hodges Apr 29, 2016, 1:46 PM

## 2016-04-27 NOTE — Progress Notes (Signed)
Patient's HR rang out 29; nonsustained. Per central monitoring tech, HR was actually 32 and patient had 3 pauses <2.5 seconds back to back. Upon assessment of patient, patient was soundly sleeping. Placed pulse oximeter on patient at this time to monitor oxygen levels. HR currently afib in the 50s. Will continue to monitor.

## 2016-04-27 NOTE — Progress Notes (Signed)
Subjective:  Patient denies any chest pain.  States breathing and leg swelling has improved.   Denies any dizziness, lightheadedness.  Noted to have A. Fib with slow ventricular response, asymptomatic  Objective:  Vital Signs in the last 24 hours: Temp:  [97.7 F (36.5 C)-97.9 F (36.6 C)] 97.7 F (36.5 C) (07/07 0338) Pulse Rate:  [49-60] 51 (07/07 0338) Resp:  [18-27] 20 (07/07 0338) BP: (104-137)/(55-75) 104/60 mmHg (07/07 0338) SpO2:  [93 %-100 %] 100 % (07/07 0338) Weight:  [91.218 kg (201 lb 1.6 oz)] 91.218 kg (201 lb 1.6 oz) (07/07 0338)  Intake/Output from previous day: 07/06 0701 - 07/07 0700 In: 720 [P.O.:720] Out: 1625 [Urine:1625] Intake/Output from this shift: Total I/O In: -  Out: 250 [Urine:250]  Physical Exam: Neck: no adenopathy, no carotid bruit and supple, symmetrical, trachea midline Lungs: decreased breath sounds at bases with faint rales Heart: irregularly irregular rhythm, S1, S2 normal and 2/6 systolic murmur and S3 gallop noted Abdomen: soft, non-tender; bowel sounds normal; no masses,  no organomegaly Extremities: no clubbing, cyanosis, 2+ edema noted  Lab Results:  Recent Labs  04/26/16 0930  WBC 5.3  HGB 11.8*  PLT 175    Recent Labs  04/26/16 0930 04/27/16 0337  NA 139 140  K 4.3 3.7  CL 106 105  CO2 24 28  GLUCOSE 125* 90  BUN 43* 42*  CREATININE 1.82* 1.79*    Recent Labs  04/26/16 1800 04/26/16 2333  TROPONINI 0.04* 0.04*   Hepatic Function Panel No results for input(s): PROT, ALBUMIN, AST, ALT, ALKPHOS, BILITOT, BILIDIR, IBILI in the last 72 hours. No results for input(s): CHOL in the last 72 hours. No results for input(s): PROTIME in the last 72 hours.  Imaging: Imaging results have been reviewed and Dg Chest Port 1 View  04/26/2016  CLINICAL DATA:  Shortness of breath. EXAM: PORTABLE CHEST 1 VIEW COMPARISON:  05/13/2015. FINDINGS: Prior CABG. Cardiomegaly with pulmonary vascular prominence and bilateral mild  interstitial prominence suggesting mild congestive heart failure. No prominent pleural effusion. Cardiophrenic angles not imaged. No pneumothorax. IMPRESSION: Prior CABG. Cardiomegaly with mild pulmonary vascular prominence interstitial prominence suggesting mild congestive heart failure. Electronically Signed   By: Marcello Moores  Register   On: 04/26/2016 09:46    Cardiac Studies:  Assessment/Plan:  Acute decompensated systolic congestive heart failure Stable angina Coronary artery disease status post CABG 1983 subsequently had PCI to protected left main/left circumflex and stiffness when graft to LAD in February 2016 Mitral regurgitation Hypertension Hyperlipidemia Chronic A. fib chadsvasc score of 5 COPD Remote tobacco abuse Chronic kidney disease stage III History of kidney stones in the past Degenerative joint disease History of pancreatitis in the past Plan DC Nitropaste. Start BiDil half tablet twice daily. Check labs in a.m. OT/ PT consult.   LOS: 1 day    Charolette Forward 04/27/2016, 9:19 AM

## 2016-04-27 NOTE — Care Management Obs Status (Signed)
[place] NOTIFICATION   Patient Details  Name: Gregg Hodges MRN: [id number] Date of Birth: 10-Jun-1926   Medicare Observation Status Notification Given:  Yes    Erenest Rasher, RN 04/27/2016, 11:27 AM

## 2016-04-27 NOTE — Care Management Note (Addendum)
Case Management Note  Patient Details  Name: Gregg Hodges MRN: [id number] Date of Birth: 1926/01/29  Subjective/Objective:      CHF            Action/Plan: Discharge Planning: Contacted CSW for ALF.   NCM spoke to pt and dtr-in-law, Gregg Hodges # [phone]. States pt lives at home alone. He is really ready to look at going to an Retirement [facility]/ALF. States he goes to [facility], PCP Dr Duke Salvia fax [phone], notified [facility] of hospital admission. Pt states he gets his medications from [facility]. Has RW, and cane at home. His dtr-in-law drives him to his appts. Pt would benefit from [place] for Telehealth program. Will orders for [facility] RN. Waiting final recommendations and orders for home.   PCP - Dr. Duke Salvia at [facility]  04/27/2016 [place] Weekend NCM please fax dc summary to [facility]. Pt requested [facility] for [facility] RN, will need orders for [facility]. Left message on [facility] Liaison left voice mail. Will verify at time of dc that [facility] accepted referral. Pt will dc to dtr-in-law home temp, [place], [place] [id number].     Expected Discharge Date:                  Expected Discharge Plan:  Gregg Hodges  In-House Referral:  Clinical Social Work  Discharge planning Services  CM Consult  Post Acute Care Choice:    Choice offered to:     DME Arranged:  N/A DME Agency:  NA  HH Arranged:    HH Agency:     Status of Service:  In process, will continue to follow  If discussed at Long Length of Stay Meetings, dates discussed:    Additional Comments:  Erenest Rasher, RN 04/27/2016, 11:48 AM

## 2016-04-27 NOTE — Evaluation (Signed)
Occupational Therapy Evaluation Patient Details Name: Gregg Hodges MRN: [id number] DOB: 16-Sep-1926 Today's Date: 04/27/2016    History of Present Illness Pt is an 82 y/o male presented with shortness of breath associated with leg swelling for last few days patient also gives history of PND orthopnea. Also complains of retrosternal chest pain off and on. Pt admitted wtih CHF   Clinical Impression   Pt performing ADL and ADL transfers at a modified independent level. Reinforced use of walker as per PT for energy conservation and fall prevention. Pt considering independent living vs ALF.    Follow Up Recommendations  No OT follow up    Equipment Recommendations  None recommended by OT    Recommendations for Other Services       Precautions / Restrictions Precautions Precautions: Fall Restrictions Weight Bearing Restrictions: No      Mobility Bed Mobility Overal bed mobility: Independent             General bed mobility comments: pt in chair  Transfers Overall transfer level: Modified independent Equipment used: Straight cane             General transfer comment: no LOB with sit to stand or SPT    Balance                                            ADL Overall ADL's : Modified independent                                             Vision     Perception     Praxis      Pertinent Vitals/Pain Pain Assessment: No/denies pain     Hand Dominance Right   Extremity/Trunk Assessment Upper Extremity Assessment Upper Extremity Assessment: Overall WFL for tasks assessed   Lower Extremity Assessment Lower Extremity Assessment: Overall WFL for tasks assessed   Cervical / Trunk Assessment Cervical / Trunk Assessment: Kyphotic   Communication Communication Communication: No difficulties   Cognition Arousal/Alertness: Awake/alert Behavior During Therapy: WFL for tasks assessed/performed Overall Cognitive Status:  Within Functional Limits for tasks assessed                     General Comments       Exercises       Shoulder Instructions      Home Living Family/patient expects to be discharged to:: Private residence Living Arrangements: Alone Available Help at Discharge: Family;Available PRN/intermittently Type of Home: House Home Access: Stairs to enter [employer] of Steps: 2 Entrance Stairs-Rails: Left Home Layout: One level               Home Equipment: Walker - 4 wheels;Cane - single point   Additional Comments: pt's family looking into independent or assisted living facilities      Prior Functioning/Environment Level of Independence: Independent with assistive device(s)        Comments: uses cane in house, rollator in community    OT Diagnosis:  (decreased activity tolerance)   OT Problem List:     OT Treatment/Interventions:      OT Goals(Current goals can be found in the care plan section) Acute Rehab OT Goals Patient Stated Goal: go home  OT Frequency:  Barriers to D/C:            Co-evaluation              End of Session Equipment Utilized During Treatment: Gait belt  Activity Tolerance: Patient tolerated treatment well Patient left: in chair;with call bell/phone within reach;with family/visitor present   Time: 1540-1610 OT Time Calculation (min): 30 min Charges:  OT General Charges $OT Visit: 1 Procedure OT Evaluation $OT Eval Moderate Complexity: 1 Procedure OT Treatments $Self Care/Home Management : 8-22 mins G-Codes:    Malka So 04/27/2016, 4:31 PM  [phone]

## 2016-04-28 LAB — BASIC METABOLIC PANEL
Anion gap: 7 (ref 5–15)
BUN: 38 mg/dL — AB (ref 6–20)
CALCIUM: 8.6 mg/dL — AB (ref 8.9–10.3)
CO2: 28 mmol/L (ref 22–32)
Chloride: 105 mmol/L (ref 101–111)
Creatinine, Ser: 1.72 mg/dL — ABNORMAL HIGH (ref 0.61–1.24)
GFR calc Af Amer: 39 mL/min — ABNORMAL LOW (ref >60)
GFR, EST NON AFRICAN AMERICAN: 34 mL/min — AB (ref >60)
GLUCOSE: 94 mg/dL (ref 65–99)
Potassium: 4 mmol/L (ref 3.5–5.1)
Sodium: 140 mmol/L (ref 135–145)

## 2016-04-28 LAB — BRAIN NATRIURETIC PEPTIDE: B Natriuretic Peptide: 517.2 pg/mL — ABNORMAL HIGH (ref 0.0–100.0)

## 2016-04-28 MED ORDER — ISOSORB DINITRATE-HYDRALAZINE 20-37.5 MG PO TABS
0.5000 | ORAL_TABLET | Freq: Three times a day (TID) | ORAL | Status: DC
  Administered 2016-04-28 – 2016-04-29 (×3): 0.5 via ORAL
  Filled 2016-04-28 (×3): qty 1

## 2016-04-28 NOTE — Clinical Social Work Note (Signed)
CSW notified by charge RN that patient and family wanted ALF resources/information. CSW went to patient's room but only patient in room at this time. CSW left a list of area ALFs in the room for the family to review.   Liz Beach MSW, [place], [place], [phone]

## 2016-04-28 NOTE — Progress Notes (Signed)
Subjective:  Denies any chest pain states breathing and leg swelling gradually improving. Discussed with family regarding discharge planning looking into assisted living.  Objective:  Vital Signs in the last 24 hours: Temp:  [97.8 F (36.6 C)-98.3 F (36.8 C)] 98.1 F (36.7 C) (07/08 0326) Pulse Rate:  [51-60] 51 (07/08 0913) Resp:  [20-22] 20 (07/08 0326) BP: (115-125)/(60-81) 124/60 mmHg (07/08 0912) SpO2:  [94 %-100 %] 97 % (07/08 0913) Weight:  [90.266 kg (199 lb)] 90.266 kg (199 lb) (07/08 0326)  Intake/Output from previous day: 07/07 0701 - 07/08 0700 In: 1200 [P.O.:1200] Out: 1750 [Urine:1750] Intake/Output from this shift: Total I/O In: 240 [P.O.:240] Out: 525 [Urine:525]  Physical Exam: Neck: no adenopathy, no carotid bruit, no JVD and supple, symmetrical, trachea midline Lungs: Decreased breath sound at bases with faint rales Heart: irregularly irregular rhythm, S1, S2 normal and 2/6 systolic murmur noted Abdomen: soft, non-tender; bowel sounds normal; no masses,  no organomegaly Extremities: No clubbing cyanosis 2+ edema noted  Lab Results:  Recent Labs  04/26/16 0930  WBC 5.3  HGB 11.8*  PLT 175    Recent Labs  04/27/16 0337 04/28/16 0409  NA 140 140  K 3.7 4.0  CL 105 105  CO2 28 28  GLUCOSE 90 94  BUN 42* 38*  CREATININE 1.79* 1.72*    Recent Labs  04/26/16 1800 04/26/16 2333  TROPONINI 0.04* 0.04*   Hepatic Function Panel No results for input(s): PROT, ALBUMIN, AST, ALT, ALKPHOS, BILITOT, BILIDIR, IBILI in the last 72 hours. No results for input(s): CHOL in the last 72 hours. No results for input(s): PROTIME in the last 72 hours.  Imaging: Imaging results have been reviewed and No results found.  Cardiac Studies:  Assessment/Plan:  Acute decompensated systolic congestive heart failure Stable angina Coronary artery disease status post CABG 1983 subsequently had PCI to protected left main/left circumflex and stiffness when graft to  LAD in February 2016 Mitral regurgitation Hypertension Hyperlipidemia Chronic A. fib chadsvasc score of 5 COPD Remote tobacco abuse Chronic kidney disease stage III History of kidney stones in the past Degenerative joint disease History of pancreatitis in the past Plan Increase BiDil as per orders   LOS: 2 days    Charolette Forward 04/28/2016, 10:40 AM

## 2016-04-29 LAB — BASIC METABOLIC PANEL
ANION GAP: 6 (ref 5–15)
BUN: 37 mg/dL — AB (ref 6–20)
CALCIUM: 8.7 mg/dL — AB (ref 8.9–10.3)
CO2: 28 mmol/L (ref 22–32)
Chloride: 105 mmol/L (ref 101–111)
Creatinine, Ser: 1.74 mg/dL — ABNORMAL HIGH (ref 0.61–1.24)
GFR calc Af Amer: 39 mL/min — ABNORMAL LOW (ref >60)
GFR, EST NON AFRICAN AMERICAN: 33 mL/min — AB (ref >60)
GLUCOSE: 89 mg/dL (ref 65–99)
Potassium: 4.1 mmol/L (ref 3.5–5.1)
SODIUM: 139 mmol/L (ref 135–145)

## 2016-04-29 MED ORDER — ISOSORB DINITRATE-HYDRALAZINE 20-37.5 MG PO TABS
1.0000 | ORAL_TABLET | Freq: Three times a day (TID) | ORAL | Status: DC
  Administered 2016-04-29 – 2016-05-01 (×6): 1 via ORAL
  Filled 2016-04-29 (×6): qty 1

## 2016-04-29 NOTE — Progress Notes (Signed)
Subjective:  Complaining of shortness of breath earlier this morning next morning gradually improving denies any further chest pain.  Objective:  Vital Signs in the last 24 hours: Temp:  [98 F (36.7 C)-98.5 F (36.9 C)] 98.5 F (36.9 C) (07/09 0500) Pulse Rate:  [51-52] 51 (07/09 0500) Resp:  [18] 18 (07/09 0500) BP: (113-131)/(73-78) 131/73 mmHg (07/09 0500) SpO2:  [98 %-100 %] 100 % (07/09 0500) Weight:  [89.631 kg (197 lb 9.6 oz)] 89.631 kg (197 lb 9.6 oz) (07/09 0500)  Intake/Output from previous day: 07/08 0701 - 07/09 0700 In: 960 [P.O.:960] Out: 1725 [Urine:1725] Intake/Output from this shift: Total I/O In: 480 [P.O.:480] Out: 100 [Urine:100]  Physical Exam: Neck: no adenopathy, no carotid bruit and supple, symmetrical, trachea midline Lungs: Decrease breath sounds  at bases with faint rales noted Heart: irregularly irregular rhythm, S1, S2 normal and 2/6 systolic murmur and S3 gallop noted Abdomen: soft, non-tender; bowel sounds normal; no masses,  no organomegaly Extremities: No clubbing cyanosis 1+ edema noted  Lab Results: No results for input(s): WBC, HGB, PLT in the last 72 hours.  Recent Labs  04/28/16 0409 04/29/16 0400  NA 140 139  K 4.0 4.1  CL 105 105  CO2 28 28  GLUCOSE 94 89  BUN 38* 37*  CREATININE 1.72* 1.74*    Recent Labs  04/26/16 1800 04/26/16 2333  TROPONINI 0.04* 0.04*   Hepatic Function Panel No results for input(s): PROT, ALBUMIN, AST, ALT, ALKPHOS, BILITOT, BILIDIR, IBILI in the last 72 hours. No results for input(s): CHOL in the last 72 hours. No results for input(s): PROTIME in the last 72 hours.  Imaging: Imaging results have been reviewed and No results found.  Cardiac Studies:  Assessment/Plan:  Resolving Acute decompensated systolic congestive heart failure Stable angina Coronary artery disease status post CABG 1983 subsequently had PCI to protected left main/left circumflex and stiffness when graft to LAD in  February 2016 Mitral regurgitation Hypertension Hyperlipidemia Chronic A. fib chadsvasc score of 5 COPD Remote tobacco abuse Chronic kidney disease stage III History of kidney stones in the past Degenerative joint disease History of pancreatitis in the past Plan Increase BiDil as per orders Check labs in a.m.   LOS: 3 days    Charolette Forward 04/29/2016, 11:15 AM

## 2016-04-30 ENCOUNTER — Inpatient Hospital Stay (HOSPITAL_COMMUNITY): Payer: Medicare Other

## 2016-04-30 LAB — BASIC METABOLIC PANEL
Anion gap: 6 (ref 5–15)
BUN: 37 mg/dL — ABNORMAL HIGH (ref 6–20)
CHLORIDE: 104 mmol/L (ref 101–111)
CO2: 29 mmol/L (ref 22–32)
CREATININE: 1.72 mg/dL — AB (ref 0.61–1.24)
Calcium: 9 mg/dL (ref 8.9–10.3)
GFR, EST AFRICAN AMERICAN: 39 mL/min — AB (ref >60)
GFR, EST NON AFRICAN AMERICAN: 34 mL/min — AB (ref >60)
GLUCOSE: 94 mg/dL (ref 65–99)
Potassium: 4.6 mmol/L (ref 3.5–5.1)
Sodium: 139 mmol/L (ref 135–145)

## 2016-04-30 LAB — BRAIN NATRIURETIC PEPTIDE: B Natriuretic Peptide: 640 pg/mL — ABNORMAL HIGH (ref 0.0–100.0)

## 2016-04-30 NOTE — Care Management Important Message (Signed)
Important Message  Patient Details  Name: Gregg Hodges MRN: [id number] Date of Birth: [birth year]   Medicare Important Message Given:  Yes    Loann Quill 04/30/2016, 8:26 AM

## 2016-04-30 NOTE — Progress Notes (Signed)
Subjective:  Denies any chest pain.  States breathing and leg swelling has improved.  Family deciding regarding independent living or assisted living.  Objective:  Vital Signs in the last 24 hours: Temp:  [97.9 F (36.6 C)-98.2 F (36.8 C)] 97.9 F (36.6 C) (07/10 0529) Pulse Rate:  [51-72] 51 (07/10 0529) Resp:  [16-18] 16 (07/10 0529) BP: (113-124)/(61-72) 124/72 mmHg (07/10 0529) SpO2:  [95 %-97 %] 97 % (07/10 0529) Weight:  [196 lb 3.2 oz (88.996 kg)] 196 lb 3.2 oz (88.996 kg) (07/10 0529)  Intake/Output from previous day: 07/09 0701 - 07/10 0700 In: 960 [P.O.:960] Out: 1150 [Urine:1150] Intake/Output from this shift: Total I/O In: -  Out: 175 [Urine:175]  Physical Exam: Neck: no adenopathy, no carotid bruit, no JVD and supple, symmetrical, trachea midline Lungs: decreased breath sounds at bases.  Air entry improved Heart: irregularly irregular rhythm, S1, S2 normal and 2/6 systolic murmur noted Abdomen: soft, non-tender; bowel sounds normal; no masses,  no organomegaly Extremities: no clubbing, cyanosis, 1+ edema noted  Lab Results: No results for input(s): WBC, HGB, PLT in the last 72 hours.  Recent Labs  04/29/16 0400 04/30/16 0459  NA 139 139  K 4.1 4.6  CL 105 104  CO2 28 29  GLUCOSE 89 94  BUN 37* 37*  CREATININE 1.74* 1.72*   No results for input(s): TROPONINI in the last 72 hours.  Invalid input(s): CK, MB Hepatic Function Panel No results for input(s): PROT, ALBUMIN, AST, ALT, ALKPHOS, BILITOT, BILIDIR, IBILI in the last 72 hours. No results for input(s): CHOL in the last 72 hours. No results for input(s): PROTIME in the last 72 hours.  Imaging: Imaging results have been reviewed and No results found.  Cardiac Studies:  Assessment/Plan:  Resolving Acute decompensated systolic congestive heart failure Stable angina Coronary artery disease status post CABG 1983 subsequently had PCI to protected left main/left circumflex and stiffness when graft  to LAD in February 2016 Mitral regurgitation Hypertension Hyperlipidemia Chronic A. fib chadsvasc score of 5 COPD Remote tobacco abuse Chronic kidney disease stage III History of kidney stones in the past Degenerative joint disease History of pancreatitis in the past Plan Continue present management. Social services for discharge planning. Chest x-ray in a.m.  LOS: 4 days    Charolette Forward 04/30/2016, 1:03 PM

## 2016-04-30 NOTE — Care Management Note (Addendum)
Case Management Note  Patient Details  Name: Gregg Hodges MRN: [id number] Date of Birth: [birth year]  Subjective/Objective:      Pt presented for SOB-CHF. Pt has family support. Family is agreeable that pt not be d/c home alone. Family is looking at ALF.               Action/Plan: Pt continues on IV Lasix. CM will continue to monitor for disposition needs.    Expected Discharge Date:                  Expected Discharge Plan:  [place]  In-House Referral:  Clinical Social Work  Discharge planning Services  CM Consult  Post Acute Care Choice:   Home Healh Choice offered to:   Patient  DME Arranged:  N/A DME Agency:  NA  HH Arranged:   RN [place] Agency:   [facility]  Status of Service: Completed. If discussed at [place] of Stay Meetings, dates discussed:    Additional Comments: 1105 05-01-16 Gregg Krauss, RN,BSN [phone] Pt plan for d/c today- Family will take pt home and plan will be for [facility] once bed becomes available.  RN Home Health set up via [facility]. No further needs from CM at this time.   Gregg Roys, RN 04/30/2016, 3:36 PM

## 2016-05-01 MED ORDER — FUROSEMIDE 40 MG PO TABS: 40.0000 mg | ORAL_TABLET | Freq: Every day | ORAL | Status: DC

## 2016-05-01 MED ORDER — ATORVASTATIN CALCIUM 40 MG PO TABS: 40.0000 mg | ORAL_TABLET | Freq: Every day | ORAL | Status: DC

## 2016-05-01 MED ORDER — ISOSORB DINITRATE-HYDRALAZINE 20-37.5 MG PO TABS: 1.0000 | ORAL_TABLET | Freq: Three times a day (TID) | ORAL | Status: DC

## 2016-05-01 NOTE — Discharge Instructions (Signed)

## 2016-05-01 NOTE — Discharge Summary (Signed)
Discharge summary dictated on 05/01/2016 dictation number is [phone]

## 2016-05-01 NOTE — Clinical Social Work Note (Signed)
CSW met with patient. Daughter-in-law, Gardiner Ramus, at bedside. Patient will go home with her for a few days then go to [facility] ALF toward the end of the week. Discussed with RNCM.  Dayton Scrape, [place]

## 2016-05-02 NOTE — Discharge Summary (Signed)
[name], STAMP                ACCOUNT NO.:  [id number]  MEDICAL RECORD NO.:  [id number]  LOCATION:  3W13C                        FACILITY:  [facility]  PHYSICIAN:  Kenyata Guess N. Terrence Dupont, M.D. DATE OF BIRTH:  Aug 17, 1926  DATE OF ADMISSION:  04/26/2016 DATE OF DISCHARGE:  05/01/2016                              DISCHARGE SUMMARY   ADMITTING DIAGNOSES: 1. Acute decompensated systolic congestive heart failure. 2. Unstable angina, rule out myocardial infarction. 3. Coronary artery disease status post CABG in 1983, subsequently had     PCI to protected left main/left circumflex and saphenous vein graft     to LAD in February, 2016. 4. Mitral regurgitation. 5. Hypertension. 6. Hyperlipidemia. 7. Chronic atrial fibrillation with CHADS-VASc score of 5. 8. Chronic obstructive pulmonary disease. 9. Remote tobacco abuse. 10.Chronic kidney disease, stage 3. 11.History of kidney stones in the past. 12.Degenerative joint disease. 13.History of pancreatitis in the past.  DISCHARGE DIAGNOSES: 1. Compensated systolic congestive heart failure.  Stable angina. 2. Coronary artery disease status post CABG in 1983, subsequently had     PCI to protected left main, left circumflex and also saphenous vein     graft to LAD in February 2016. 3. Mitral regurgitation. 4. Hypertension. 5. Hyperlipidemia. 6. Chronic atrial fibrillation with slow ventricular response, CHADS-     VASc score of 5. 7. Chronic obstructive pulmonary disease. 8. Remote tobacco abuse. 9. Chronic kidney disease, stage 3. 10.History of kidney stones in the past. 11.Degenerative joint disease. 12.History of pancreatitis in the past.  DISCHARGE MEDICATIONS: 1. BiDil 1 tablet 3 times daily. 2. Tylenol 650 mg twice daily as needed. 3. Eliquis 2.5 mg 1 tablet twice daily. 4. Clopidogrel 75 mg half tablet daily. 5. Nitrostat sublingual p.r.n. 6. Omeprazole 20 mg daily. 7. Lasix 40 mg daily. 8. Atorvastatin 40 mg daily. 9. The  patient has been advised to stop amlodipine.  DIET:  Low salt, low cholesterol.  ACTIVITY:  Increase activity slowly as tolerated.  Heart failure instructions have been given.  The patient will be discharged home with niece and then will be transferred to [place] in the next few days.  CONDITION AT DISCHARGE:  Stable.  BRIEF HISTORY AND HOSPITAL COURSE:  [name] is an 82 year old male with past medical history significant for coronary artery disease status post CABG in 1983, approximately 34 years ago, status post PTCA stenting to protected left main/left circumflex and saphenous vein graft to LAD in February 2016, hypertension chronic atrial fibrillation on chronic anticoagulation, CHADS score of 5; COPD, degenerative joint disease, history of abdominal aortic aneurysm status post repair in the past, remote tobacco abuse, chronic kidney disease, stage 3; history of congestive heart failure secondary to depressed LV systolic function, valvular heart disease, who came to the ER complaining of progressively increasing shortness of breath associated with leg swelling for the last few days.  The patient also gives history of PND, orthopnea, also complains of retrosternal chest pain off and on, requiring occasional sublingual nitro, states chest pain earlier this morning.  Took 1 sublingual nitro with relief of chest pain.  Denies any excessive salty food intake.  Denies cough, fever, or chills.  Denies  noncompliance to medication.  EKG done in the ER showed AFib with controlled ventricular response, old inferior wall MI, and right bundle-branch block.  No new acute ischemic changes were noted.  The patient states recently his [facility] to 40 mg daily.  PHYSICAL EXAMINATION:  GENERAL:  He was alert, awake, oriented x3. VITAL SIGNS:  Blood pressure was 128/65, pulse 49, irregularly irregular. HEENT:  Conjunctivae pink. NECK:  Supple.   Positive JVD. LUNGS:  Decreased breath sounds at bases with bibasilar rales. CARDIOVASCULAR:  Irregularly irregular, S1-S2 were soft, there was 2/6 systolic murmur and S3 gallop noted. ABDOMEN:  Soft, bowel sounds present, nontender. EXTREMITIES:  No clubbing or cyanosis.  There was 3+ edema.  LABORATORY DATA:  Sodium was 139, potassium 4.3, BUN 43, creatinine 1.82, BNP was 451.  Hemoglobin was 11.8, hematocrit 38.1, white count of 5.3.  Chest x-ray showed prior CABG, cardiomegaly with mild pulmonary vascular prominence, interstitial prominence suggestive of mild congestive heart failure.  Repeat chest x-ray done today, showed no acute cardiopulmonary disease.  Lungs are mildly hyperexpanded, but clear.  No pleural effusion or pneumothorax.  His troponin-I were 0.03, 0.04, 0.04, and 0.01.  BRIEF HOSPITAL COURSE:  The patient was admitted to telemetry unit.  The patient did not have any further episodes of chest pain.  MI was ruled out by serial enzymes and EKG.  The patient was started on IV Lasix with good diuresis.  He has lost approximately 10 pounds of dry weight during the hospital stay.  His breathing was markedly improved.  His amlodipine was switched to BiDil in view of depressed LV systolic function and renal insufficiency, which he is tolerating well.  His leg swelling is almost completely resolved.  The patient will be discharged home with family and then will be transferred to [facility] in few days as family is working to place patient in assisted living.     [name]. Terrence Dupont, M.D.     MNH/MEDQ  D:  05/01/2016  T:  05/02/2016  Job:  [id number]

## 2016-06-20 ENCOUNTER — Encounter: Payer: Self-pay | Admitting: Podiatry

## 2016-06-20 ENCOUNTER — Ambulatory Visit (INDEPENDENT_AMBULATORY_CARE_PROVIDER_SITE_OTHER): Payer: Medicare Other | Admitting: Podiatry

## 2016-06-20 VITALS — BP 130/59 | HR 54 | Resp 14

## 2016-06-20 DIAGNOSIS — M79676 Pain in unspecified toe(s): Secondary | ICD-10-CM | POA: Diagnosis not present

## 2016-06-20 DIAGNOSIS — B351 Tinea unguium: Secondary | ICD-10-CM | POA: Diagnosis not present

## 2016-06-20 DIAGNOSIS — I200 Unstable angina: Secondary | ICD-10-CM | POA: Diagnosis not present

## 2016-06-20 NOTE — Progress Notes (Signed)
   Subjective:    Patient ID: Gregg Hodges, male    DOB: [birth year], 82 y.o.   MRN: [id number]  HPI This patient presents to the office with long thick nails.  Nails atre painful walking and wearing his shoes.  Patient has CHF and is on blood thyinners.  He presents for preventive foot care services.    Review of Systems  All other systems reviewed and are negative.      Objective:   Physical Exam GENERAL APPEARANCE: Alert, conversant. Appropriately groomed. No acute distress.  VASCULAR: Pedal pulses are  palpable at  DP.    PT are  non palpable bilateral.  Capillary refill time is immediate to all digits,  Normal temperature gradient.  D NEUROLOGIC: sensation is normal to 5.07 monofilament at 5/5 sites bilateral.  Light touch is intact bilateral, Muscle strength normal.  MUSCULOSKELETAL: acceptable muscle strength, tone and stability bilateral.  Intrinsic muscluature intact bilateral.  Rectus appearance of foot and digits noted bilateral. Severe swelling  B/L  DERMATOLOGIC: skin color, texture, and turgor are within normal limits.  No preulcerative lesions or ulcers  are seen, no interdigital maceration noted.  No open lesions present.   No drainage noted.  NAILS  Thick disfigured discolored nails both feet.                                     Assessment & Plan:  Diagnosis  Onychomycosis  B/L  Treatment  IE  Debridement of nails     RTC 10 weeks.   Gardiner Barefoot DPM

## 2016-08-16 ENCOUNTER — Encounter (HOSPITAL_COMMUNITY): Payer: Self-pay

## 2016-08-16 ENCOUNTER — Emergency Department (HOSPITAL_COMMUNITY): Payer: Medicare Other

## 2016-08-16 ENCOUNTER — Inpatient Hospital Stay (HOSPITAL_COMMUNITY)
Admission: EM | Admit: 2016-08-16 | Discharge: 2016-08-19 | DRG: 280 | Disposition: A | Discharge status: Home Health | Payer: Medicare Other | Attending: Cardiology | Admitting: Cardiology

## 2016-08-16 DIAGNOSIS — I482 Chronic atrial fibrillation: Secondary | ICD-10-CM | POA: Diagnosis present

## 2016-08-16 DIAGNOSIS — Z85828 Personal history of other malignant neoplasm of skin: Secondary | ICD-10-CM | POA: Diagnosis not present

## 2016-08-16 DIAGNOSIS — Z66 Do not resuscitate: Secondary | ICD-10-CM | POA: Diagnosis present

## 2016-08-16 DIAGNOSIS — Z951 Presence of aortocoronary bypass graft: Secondary | ICD-10-CM | POA: Diagnosis not present

## 2016-08-16 DIAGNOSIS — I5023 Acute on chronic systolic (congestive) heart failure: Secondary | ICD-10-CM | POA: Diagnosis present

## 2016-08-16 DIAGNOSIS — I130 Hypertensive heart and chronic kidney disease with heart failure and stage 1 through stage 4 chronic kidney disease, or unspecified chronic kidney disease: Secondary | ICD-10-CM | POA: Diagnosis present

## 2016-08-16 DIAGNOSIS — Z87891 Personal history of nicotine dependence: Secondary | ICD-10-CM | POA: Diagnosis not present

## 2016-08-16 DIAGNOSIS — I252 Old myocardial infarction: Secondary | ICD-10-CM | POA: Diagnosis not present

## 2016-08-16 DIAGNOSIS — J449 Chronic obstructive pulmonary disease, unspecified: Secondary | ICD-10-CM | POA: Diagnosis present

## 2016-08-16 DIAGNOSIS — R001 Bradycardia, unspecified: Secondary | ICD-10-CM

## 2016-08-16 DIAGNOSIS — Z955 Presence of coronary angioplasty implant and graft: Secondary | ICD-10-CM

## 2016-08-16 DIAGNOSIS — I25700 Atherosclerosis of coronary artery bypass graft(s), unspecified, with unstable angina pectoris: Secondary | ICD-10-CM

## 2016-08-16 DIAGNOSIS — Z7901 Long term (current) use of anticoagulants: Secondary | ICD-10-CM | POA: Diagnosis not present

## 2016-08-16 DIAGNOSIS — I214 Non-ST elevation (NSTEMI) myocardial infarction: Secondary | ICD-10-CM | POA: Diagnosis present

## 2016-08-16 DIAGNOSIS — Z96653 Presence of artificial knee joint, bilateral: Secondary | ICD-10-CM | POA: Diagnosis present

## 2016-08-16 DIAGNOSIS — N183 Chronic kidney disease, stage 3 (moderate): Secondary | ICD-10-CM | POA: Diagnosis present

## 2016-08-16 DIAGNOSIS — I249 Acute ischemic heart disease, unspecified: Secondary | ICD-10-CM | POA: Diagnosis present

## 2016-08-16 DIAGNOSIS — E785 Hyperlipidemia, unspecified: Secondary | ICD-10-CM | POA: Diagnosis present

## 2016-08-16 DIAGNOSIS — I5021 Acute systolic (congestive) heart failure: Secondary | ICD-10-CM

## 2016-08-16 DIAGNOSIS — R079 Chest pain, unspecified: Secondary | ICD-10-CM | POA: Diagnosis present

## 2016-08-16 DIAGNOSIS — R0789 Other chest pain: Secondary | ICD-10-CM

## 2016-08-16 LAB — COMPREHENSIVE METABOLIC PANEL
ALBUMIN: 3.9 g/dL (ref 3.5–5.0)
ALK PHOS: 77 U/L (ref 38–126)
ALT: 17 U/L (ref 17–63)
AST: 28 U/L (ref 15–41)
Anion gap: 11 (ref 5–15)
BILIRUBIN TOTAL: 2 mg/dL — AB (ref 0.3–1.2)
BUN: 40 mg/dL — AB (ref 6–20)
CALCIUM: 9.4 mg/dL (ref 8.9–10.3)
CO2: 25 mmol/L (ref 22–32)
CREATININE: 2.12 mg/dL — AB (ref 0.61–1.24)
Chloride: 103 mmol/L (ref 101–111)
GFR calc Af Amer: 30 mL/min — ABNORMAL LOW (ref >60)
GFR calc non Af Amer: 26 mL/min — ABNORMAL LOW (ref >60)
GLUCOSE: 93 mg/dL (ref 65–99)
Potassium: 4.7 mmol/L (ref 3.5–5.1)
SODIUM: 139 mmol/L (ref 135–145)
Total Protein: 7.3 g/dL (ref 6.5–8.1)

## 2016-08-16 LAB — CBC WITH DIFFERENTIAL/PLATELET
BASOS PCT: 1 %
Basophils Absolute: 0 10*3/uL (ref 0.0–0.1)
EOS ABS: 0.2 10*3/uL (ref 0.0–0.7)
Eosinophils Relative: 3 %
HEMATOCRIT: 36.3 % — AB (ref 39.0–52.0)
HEMOGLOBIN: 11.3 g/dL — AB (ref 13.0–17.0)
Lymphocytes Relative: 9 %
Lymphs Abs: 0.6 10*3/uL — ABNORMAL LOW (ref 0.7–4.0)
MCH: 27.1 pg (ref 26.0–34.0)
MCHC: 31.1 g/dL (ref 30.0–36.0)
MCV: 87.1 fL (ref 78.0–100.0)
Monocytes Absolute: 0.6 10*3/uL (ref 0.1–1.0)
Monocytes Relative: 9 %
NEUTROS ABS: 4.7 10*3/uL (ref 1.7–7.7)
NEUTROS PCT: 78 %
Platelets: 170 10*3/uL (ref 150–400)
RBC: 4.17 MIL/uL — AB (ref 4.22–5.81)
RDW: 18.5 % — ABNORMAL HIGH (ref 11.5–15.5)
WBC: 6.1 10*3/uL (ref 4.0–10.5)

## 2016-08-16 LAB — I-STAT TROPONIN, ED
Troponin i, poc: 0.03 ng/mL (ref 0.00–0.08)
Troponin i, poc: 0.06 ng/mL (ref 0.00–0.08)

## 2016-08-16 LAB — TROPONIN I
TROPONIN I: 0.17 ng/mL — AB (ref <0.03)
Troponin I: 0.21 ng/mL (ref <0.03)

## 2016-08-16 LAB — MAGNESIUM: Magnesium: 2.5 mg/dL — ABNORMAL HIGH (ref 1.7–2.4)

## 2016-08-16 LAB — BRAIN NATRIURETIC PEPTIDE: B Natriuretic Peptide: 1383.1 pg/mL — ABNORMAL HIGH (ref 0.0–100.0)

## 2016-08-16 MED ORDER — ACETAMINOPHEN 325 MG PO TABS: 650.0000 mg | ORAL_TABLET | ORAL | Status: DC | PRN

## 2016-08-16 MED ORDER — ONDANSETRON HCL 4 MG/2ML IJ SOLN: 4.0000 mg | Freq: Four times a day (QID) | INTRAMUSCULAR | Status: DC | PRN

## 2016-08-16 MED ORDER — ACETAMINOPHEN 325 MG PO TABS
650.0000 mg | ORAL_TABLET | Freq: Two times a day (BID) | ORAL | Status: DC
  Administered 2016-08-16 – 2016-08-19 (×7): 650 mg via ORAL
  Filled 2016-08-16 (×7): qty 2

## 2016-08-16 MED ORDER — APIXABAN 2.5 MG PO TABS
2.5000 mg | ORAL_TABLET | Freq: Two times a day (BID) | ORAL | Status: DC
  Administered 2016-08-16 – 2016-08-19 (×7): 2.5 mg via ORAL
  Filled 2016-08-16 (×7): qty 1

## 2016-08-16 MED ORDER — CLOPIDOGREL BISULFATE 75 MG PO TABS
75.0000 mg | ORAL_TABLET | Freq: Once | ORAL | Status: AC
  Administered 2016-08-16: 75 mg via ORAL
  Filled 2016-08-16: qty 1

## 2016-08-16 MED ORDER — FUROSEMIDE 10 MG/ML IJ SOLN
40.0000 mg | Freq: Every day | INTRAMUSCULAR | Status: DC
  Administered 2016-08-16 – 2016-08-19 (×4): 40 mg via INTRAVENOUS
  Filled 2016-08-16 (×4): qty 4

## 2016-08-16 MED ORDER — SODIUM CHLORIDE 0.9 % IV SOLN: INTRAVENOUS | Status: DC

## 2016-08-16 MED ORDER — PANTOPRAZOLE SODIUM 40 MG PO TBEC
40.0000 mg | DELAYED_RELEASE_TABLET | Freq: Every day | ORAL | Status: DC
  Administered 2016-08-16 – 2016-08-19 (×4): 40 mg via ORAL
  Filled 2016-08-16 (×4): qty 1

## 2016-08-16 MED ORDER — NITROGLYCERIN 2 % TD OINT
0.5000 [in_us] | TOPICAL_OINTMENT | Freq: Three times a day (TID) | TRANSDERMAL | Status: DC
  Filled 2016-08-16: qty 30

## 2016-08-16 MED ORDER — ATORVASTATIN CALCIUM 40 MG PO TABS
40.0000 mg | ORAL_TABLET | Freq: Every day | ORAL | Status: DC
  Administered 2016-08-16 – 2016-08-18 (×3): 40 mg via ORAL
  Filled 2016-08-16 (×3): qty 1

## 2016-08-16 MED ORDER — NITROGLYCERIN 0.4 MG SL SUBL: 0.4000 mg | SUBLINGUAL_TABLET | SUBLINGUAL | Status: DC | PRN

## 2016-08-16 NOTE — ED Notes (Signed)
Called lab and they will add on BNP.

## 2016-08-16 NOTE — ED Triage Notes (Signed)
Pt brought in by EMS due to having chest pain during his shower this morning. Pt states "a feeling of dizziness came over and I thought I was going to pass out." Pt a&ox4. Pt has received nitrox3 and 324mg  of aspirin.

## 2016-08-16 NOTE — ED Notes (Signed)
Patient undressed, in gown, on monitor, continuous pulse oximetry and blood pressure cuff 

## 2016-08-16 NOTE — ED Provider Notes (Signed)
[facility] DEPT Provider Note   CSN: [id number] Arrival date & time: 08/16/16  0844     History   Chief Complaint Chief Complaint  Patient presents with  . Chest Pain    HPI Gregg Hodges is a 82 y.o. male.  HPI  Saw Dr. Cleon Dew yesterday, signs of congestive heart failure  Today developed chest pain when stood out of bed, took 1 SL Nitro, felt like squeezing pain, felt lightheaded in shower.  CP eased off then came back then took another nitro and called EMS.  Pain was 9/10, received nitro x3 with EMS and now pain has resolved.  No lightheadedness.  No SOB  20yr ago in feb had stents placed  Taking lasix 80mg  however continues to have bilat leg swelling xmos   Past Medical History:  Diagnosis Date  . Anginal pain ([place])   . Arthritis    "all over" (04/26/2016)  . Cancer ([place])    Malignant adnoid carcinoma (roof of mouth)  . CHF (congestive heart failure) ([place])   . Chronic back pain   . CKD (chronic kidney disease), stage III   . Complication of anesthesia    only to ether  . Coronary artery disease   . Foot drop, right    "had aneurysm behind my knee; foot corrected itself"  . GERD (gastroesophageal reflux disease)   . Heart murmur   . High cholesterol   . Hypertension   . Insomnia   . Myocardial infarction 11/2014  . Renal disorder    kidney stones, resulted in blockage of L kidney, later removed due to damage  . Shortness of breath dyspnea     Patient Active Problem List   Diagnosis Date Noted  . CHF (congestive heart failure) ([place]) 04/26/2016  . Unstable angina ([place]) 03/06/2016  . Acute coronary syndrome ([place]) 12/02/2014  . Choledocholithiasis 02/25/2013  . Pancreatitis, acute 02/23/2013  . Acute on chronic renal failure ([place]) 02/23/2013  . Transaminitis 02/23/2013  . CAD, ARTERY BYPASS GRAFT 01/05/2010  . CLAUDICATION 01/05/2010  . CHEST PAIN 01/05/2010  . CORONARY ARTERY DISEASE 12/22/2009  . ATRIAL FIBRILLATION 12/22/2009  . Chronic  diastolic heart failure ([place]) 12/22/2009    Past Surgical History:  Procedure Laterality Date  . ABDOMINAL AORTIC ANEURYSM REPAIR    . CHOLECYSTECTOMY    . CORONARY ARTERY BYPASS GRAFT  1983   "CABG X5"  . ERCP N/A 02/25/2013   Procedure: ENDOSCOPIC RETROGRADE CHOLANGIOPANCREATOGRAPHY (ERCP);  Surgeon: Jeryl Columbia, MD;  Location: [facility] ORS;  Service: Endoscopy;  Laterality: N/A;  . ESOPHAGOGASTRODUODENOSCOPY    . EXCISIONAL HEMORRHOIDECTOMY    . INGUINAL HERNIA REPAIR Right   . JOINT REPLACEMENT    . LEFT HEART CATHETERIZATION WITH CORONARY/GRAFT ANGIOGRAM N/A 12/02/2014   Procedure: LEFT HEART CATHETERIZATION WITH Beatrix Fetters;  Surgeon: Clent Demark, MD;  Location: [facility] CATH LAB;  Service: Cardiovascular;  Laterality: N/A;  . SKIN CANCER EXCISION  ~ 2000   "roof of my mouth; ~ size of quarter; benign; [place]"  . TONSILLECTOMY    . TOTAL KNEE ARTHROPLASTY Bilateral        Home Medications    Prior to Admission medications   Medication Sig Start Date End Date Taking? Authorizing Provider  acetaminophen (TYLENOL) 325 MG tablet Take 650 mg by mouth 2 (two) times daily.   Yes Historical Provider, MD  apixaban (ELIQUIS) 2.5 MG TABS tablet Take 1 tablet (2.5 mg total) by mouth 2 (two) times daily. 03/07/16  Yes Prudencio Burly  Harwani, MD  atorvastatin (LIPITOR) 40 MG tablet Take 1 tablet (40 mg total) by mouth daily at 6 PM. 05/01/16  Yes Charolette Forward, MD  clopidogrel (PLAVIX) 75 MG tablet Take 0.5 tablets (37.5 mg total) by mouth daily with breakfast. 03/07/16  Yes Charolette Forward, MD  furosemide (LASIX) 40 MG tablet Take 1 tablet (40 mg total) by mouth daily. Patient taking differently: Take 40 mg by mouth 2 (two) times daily. 40 mg in the morning and 40 mg at noon 05/01/16  Yes Charolette Forward, MD  hydrALAZINE (APRESOLINE) 25 MG tablet Take 25 mg by mouth 3 (three) times daily.   Yes Historical Provider, MD  hydroxypropyl methylcellulose / hypromellose (ISOPTO TEARS / GONIOVISC) 2.5 %  ophthalmic solution Place 1 drop into both eyes at bedtime.   Yes Historical Provider, MD  isosorbide dinitrate (ISORDIL) 20 MG tablet Take 20 mg by mouth 3 (three) times daily. Patient takes with  Hydralazine 3 times daily   Yes Historical Provider, MD  nitroGLYCERIN (NITROSTAT) 0.4 MG SL tablet Place 0.4 mg under the tongue every 5 (five) minutes as needed for chest pain.   Yes Historical Provider, MD  omeprazole (PRILOSEC) 20 MG capsule Take 20 mg by mouth daily.   Yes Historical Provider, MD  traMADol (ULTRAM) 50 MG tablet Take 50 mg by mouth 2 (two) times daily.   Yes Historical Provider, MD  isosorbide-hydrALAZINE (BIDIL) 20-37.5 MG tablet Take 1 tablet by mouth 3 (three) times daily. Patient not taking: Reported on 08/16/2016 05/01/16   Charolette Forward, MD    Family History History reviewed. No pertinent family history.  Social History Social History  Substance Use Topics  . Smoking status: Former Smoker    Packs/day: 0.50    Years: 24.00    Types: Cigarettes    Quit date: 02/24/1967  . Smokeless tobacco: Never Used  . Alcohol use No     Allergies   Aspirin; Ether; and Isosorbide mononitrate   Review of Systems Review of Systems  Constitutional: Negative for fever.  HENT: Negative for sore throat.   Eyes: Negative for visual disturbance.  Respiratory: Negative for shortness of breath.   Cardiovascular: Positive for chest pain and leg swelling (19mos).  Gastrointestinal: Negative for abdominal pain, nausea and vomiting.  Genitourinary: Negative for difficulty urinating and dysuria.  Musculoskeletal: Negative for neck stiffness.  Skin: Negative for rash.  Neurological: Positive for light-headedness. Negative for syncope and headaches.     Physical Exam Updated Vital Signs BP 138/77 (BP Location: Right Arm)   Pulse (!) 49   Temp 97.5 F (36.4 C) (Oral)   Resp 18   Ht 6\' 1"  (1.854 m)   Wt 200 lb 12.8 oz (91.1 kg)   SpO2 95%   BMI 26.49 kg/m   Physical Exam    Constitutional: He is oriented to person, place, and time. He appears well-developed and well-nourished. No distress.  HENT:  Head: Normocephalic and atraumatic.  Eyes: Conjunctivae and EOM are normal.  Neck: Normal range of motion. JVD present.  Cardiovascular: Regular rhythm, normal heart sounds and intact distal pulses.  Bradycardia present.  Exam reveals no gallop and no friction rub.   No murmur heard. Pulmonary/Chest: Effort normal and breath sounds normal. No respiratory distress. He has no wheezes. He has no rales.  Abdominal: Soft. He exhibits no distension. There is no tenderness. There is no guarding.  Musculoskeletal: He exhibits edema (3+ bilaterally).  Neurological: He is alert and oriented to person, place, and time.  Skin: Skin is warm and dry. He is not diaphoretic.  Skin break down/venous stasis changes right lower ext, no sign of cellulitis  Nursing note and vitals reviewed.    ED Treatments / Results  Labs (all labs ordered are listed, but only abnormal results are displayed) Labs Reviewed  CBC WITH DIFFERENTIAL/PLATELET - Abnormal; Notable for the following:       Result Value   RBC 4.17 (*)    Hemoglobin 11.3 (*)    HCT 36.3 (*)    RDW 18.5 (*)    Lymphs Abs 0.6 (*)    All other components within normal limits  COMPREHENSIVE METABOLIC PANEL - Abnormal; Notable for the following:    BUN 40 (*)    Creatinine, Ser 2.12 (*)    Total Bilirubin 2.0 (*)    GFR calc non Af Amer 26 (*)    GFR calc Af Amer 30 (*)    All other components within normal limits  MAGNESIUM - Abnormal; Notable for the following:    Magnesium 2.5 (*)    All other components within normal limits  BRAIN NATRIURETIC PEPTIDE - Abnormal; Notable for the following:    B Natriuretic Peptide 1,383.1 (*)    All other components within normal limits  TROPONIN I - Abnormal; Notable for the following:    Troponin I 0.17 (*)    All other components within normal limits  TROPONIN I  TROPONIN I   BASIC METABOLIC PANEL  LIPID PANEL  CBC  I-STAT TROPOININ, ED  Randolm Idol, ED  I-STAT TROPOININ, ED    EKG  EKG Interpretation  Date/Time:  Thursday August 16 2016 09:32:42 EDT Ventricular Rate:  49 PR Interval:    QRS Duration: 148 QT Interval:  493 QTC Calculation: 446 R Axis:   -121 Text Interpretation:  Ectopic atrial rhythm with bradycardia No significant change since last tracing Confirmed by [facility] MD, [place] ([id number]) on 08/16/2016 6:54:28 PM       Radiology Dg Chest Portable 1 View  Result Date: 08/16/2016 CLINICAL DATA:  Shortness of breath EXAM: PORTABLE CHEST 1 VIEW COMPARISON:  04/30/2016 FINDINGS: Cardiomegaly and pulmonary venous congestion. Limited visualization behind the heart is likely related to technique. EKG leads create artifact over the chest. Status post CABG. No effusion or pneumothorax. IMPRESSION: Cardiomegaly and pulmonary venous congestion. Electronically Signed   By: Monte Fantasia M.D.   On: 08/16/2016 10:09    Procedures Procedures (including critical care time)  Medications Ordered in ED Medications  acetaminophen (TYLENOL) tablet 650 mg (650 mg Oral Given 08/16/16 1418)  atorvastatin (LIPITOR) tablet 40 mg (0 mg Oral Duplicate [id number])  apixaban (ELIQUIS) tablet 2.5 mg (2.5 mg Oral Given 08/16/16 1418)  nitroGLYCERIN (NITROSTAT) SL tablet 0.4 mg (not administered)  pantoprazole (PROTONIX) EC tablet 40 mg (40 mg Oral Given 08/16/16 1418)  acetaminophen (TYLENOL) tablet 650 mg (not administered)  ondansetron (ZOFRAN) injection 4 mg (not administered)  nitroGLYCERIN (NITROGLYN) 2 % ointment 0.5 inch (0.5 inches Topical Not Given 08/16/16 1418)  furosemide (LASIX) injection 40 mg (40 mg Intravenous Given 08/16/16 1418)  0.9 %  sodium chloride infusion ( Intravenous Not Given 08/16/16 1326)  clopidogrel (PLAVIX) tablet 75 mg (75 mg Oral Given 08/16/16 1418)     Initial Impression / Assessment and Plan / ED Course  I have  reviewed the triage vital signs and the nursing notes.  Pertinent labs & imaging results that were available during my care of the patient were reviewed by me and  considered in my medical decision making (see chart for details).  Clinical Course   83yo male with history of CAD, CHF, htn, hypercholesterolemia presents with concern for chest pain this AM and continued leg swelling of legs.  EKG shows ectopic atrial rhythm, bradycardia. Discussed with Dr. Terrence Dupont. Low suspicion for PE or dissection by hx and physical exam. First troponin negative, however concern for anginal or ischemic chest pain, and will admit for further care to Dr. Terrence Dupont. Pt received ASA 324mg  and nitro with EMS. CP free at this time.    Final Clinical Impressions(s) / ED Diagnoses   Final diagnoses:  Other chest pain  Bradycardia    New Prescriptions Current Discharge Medication List       Gareth Morgan, MD 08/16/16 [id number]

## 2016-08-16 NOTE — H&P (Signed)
Gregg Hodges is an 82 y.o. male.   Chief Complaint: Chest pain associated with shortness of breath and dizziness HPI: Patient is 82 year old male with past medical history significant for coronary artery disease status post CABG in 1983 approximately 34 years ago status post PTCA stenting to protected left main/left circumflex and stiffness when graft to LAD in February 2016, hypertension, chronic and atrial fibrillation on chronic anticoagulation, COPD G generated joint disease, history of abdominal aortic aneurysm, status post repair in the past, remote tobacco abuse, chronic kidney disease stage 3/4, history of congestive heart failure secondary to depressed LV systolic function, valvular heart disease, came to the ER by EMS complaining of retrosternal chest pain described as tightness grade 8-9/10 associated shortness of breath and dizziness took  2 sublingual nitroglycerin with partial relief to call EMS received 3 more sublingual nitroglycerin and 325 mg aspirin with relief of chest pain. Patient denies any syncopal episode. Patient gives history of PND orthopnea and progressive leg swelling.  Past Medical History:  Diagnosis Date  . Anginal pain ([place])   . Arthritis    "all over" (04/26/2016)  . Cancer ([place])    Malignant adnoid carcinoma (roof of mouth)  . CHF (congestive heart failure) ([place])   . Chronic back pain   . CKD (chronic kidney disease), stage III   . Complication of anesthesia    only to ether  . Coronary artery disease   . Foot drop, right    "had aneurysm behind my knee; foot corrected itself"  . GERD (gastroesophageal reflux disease)   . Heart murmur   . High cholesterol   . Hypertension   . Insomnia   . Myocardial infarction 11/2014  . Renal disorder    kidney stones, resulted in blockage of L kidney, later removed due to damage  . Shortness of breath dyspnea     Past Surgical History:  Procedure Laterality Date  . ABDOMINAL AORTIC ANEURYSM REPAIR    .  CHOLECYSTECTOMY    . CORONARY ARTERY BYPASS GRAFT  1983   "CABG X5"  . ERCP N/A 02/25/2013   Procedure: ENDOSCOPIC RETROGRADE CHOLANGIOPANCREATOGRAPHY (ERCP);  Surgeon: Jeryl Columbia, MD;  Location: [facility];  Service: Endoscopy;  Laterality: N/A;  . ESOPHAGOGASTRODUODENOSCOPY    . EXCISIONAL HEMORRHOIDECTOMY    . INGUINAL HERNIA REPAIR Right   . JOINT REPLACEMENT    . LEFT HEART CATHETERIZATION WITH CORONARY/GRAFT ANGIOGRAM N/A 12/02/2014   Procedure: LEFT HEART CATHETERIZATION WITH Beatrix Fetters;  Surgeon: Clent Demark, MD;  Location: [place] CATH LAB;  Service: Cardiovascular;  Laterality: N/A;  . SKIN CANCER EXCISION  ~ 2000   "roof of my mouth; ~ size of quarter; benign; [place]"  . TONSILLECTOMY    . TOTAL KNEE ARTHROPLASTY Bilateral     No family history on file. Social History:  reports that he quit smoking about 49 years ago. His smoking use included Cigarettes. He has a 12.00 pack-year smoking history. He has never used smokeless tobacco. He reports that he does not drink alcohol or use drugs.  Allergies:  Allergies  Allergen Reactions  . Aspirin Other (See Comments)    Cant have due to blood thinner  . Ether     REACTION: Reaction not known  . Isosorbide Mononitrate     REACTION: Blurred vision,and lightheadedness.     (Not in a hospital admission)  Results for orders placed or performed during the hospital encounter of 08/16/16 (from the past 48 hour(s))  CBC with Differential  Status: Abnormal   Collection Time: 08/16/16  9:57 AM  Result Value Ref Range   WBC 6.1 4.0 - 10.5 K/uL   RBC 4.17 (L) 4.22 - 5.81 MIL/uL   Hemoglobin 11.3 (L) 13.0 - 17.0 g/dL   HCT 36.3 (L) 39.0 - 52.0 %   MCV 87.1 78.0 - 100.0 fL   MCH 27.1 26.0 - 34.0 pg   MCHC 31.1 30.0 - 36.0 g/dL   RDW 18.5 (H) 11.5 - 15.5 %   Platelets 170 150 - 400 K/uL   Neutrophils Relative % 78 %   Neutro Abs 4.7 1.7 - 7.7 K/uL   Lymphocytes Relative 9 %   Lymphs Abs 0.6 (L) 0.7 - 4.0 K/uL    Monocytes Relative 9 %   Monocytes Absolute 0.6 0.1 - 1.0 K/uL   Eosinophils Relative 3 %   Eosinophils Absolute 0.2 0.0 - 0.7 K/uL   Basophils Relative 1 %   Basophils Absolute 0.0 0.0 - 0.1 K/uL  Comprehensive metabolic panel     Status: Abnormal   Collection Time: 08/16/16  9:57 AM  Result Value Ref Range   Sodium 139 135 - 145 mmol/L   Potassium 4.7 3.5 - 5.1 mmol/L   Chloride 103 101 - 111 mmol/L   CO2 25 22 - 32 mmol/L   Glucose, Bld 93 65 - 99 mg/dL   BUN 40 (H) 6 - 20 mg/dL   Creatinine, Ser 2.12 (H) 0.61 - 1.24 mg/dL   Calcium 9.4 8.9 - 10.3 mg/dL   Total Protein 7.3 6.5 - 8.1 g/dL   Albumin 3.9 3.5 - 5.0 g/dL   AST 28 15 - 41 U/L   ALT 17 17 - 63 U/L   Alkaline Phosphatase 77 38 - 126 U/L   Total Bilirubin 2.0 (H) 0.3 - 1.2 mg/dL   GFR calc non Af Amer 26 (L) >60 mL/min   GFR calc Af Amer 30 (L) >60 mL/min    Comment: (NOTE) The eGFR has been calculated using the CKD EPI equation. This calculation has not been validated in all clinical situations. eGFR's persistently <60 mL/min signify possible Chronic Kidney Disease.    Anion gap 11 5 - 15  Magnesium     Status: Abnormal   Collection Time: 08/16/16  9:57 AM  Result Value Ref Range   Magnesium 2.5 (H) 1.7 - 2.4 mg/dL  Brain natriuretic peptide     Status: Abnormal   Collection Time: 08/16/16  9:57 AM  Result Value Ref Range   B Natriuretic Peptide 1,383.1 (H) 0.0 - 100.0 pg/mL  I-Stat Troponin, ED - 0, 3, 6 hours (not at [facility])     Status: None   Collection Time: 08/16/16 10:04 AM  Result Value Ref Range   Troponin i, poc 0.03 0.00 - 0.08 ng/mL   Comment 3            Comment: Due to the release kinetics of cTnI, a negative result within the first hours of the onset of symptoms does not rule out myocardial infarction with certainty. If myocardial infarction is still suspected, repeat the test at appropriate intervals.    Dg Chest Portable 1 View  Result Date: 08/16/2016 CLINICAL DATA:  Shortness of  breath EXAM: PORTABLE CHEST 1 VIEW COMPARISON:  04/30/2016 FINDINGS: Cardiomegaly and pulmonary venous congestion. Limited visualization behind the heart is likely related to technique. EKG leads create artifact over the chest. Status post CABG. No effusion or pneumothorax. IMPRESSION: Cardiomegaly and pulmonary venous congestion. Electronically Signed  By: Monte Fantasia M.D.   On: 08/16/2016 10:09    Review of Systems  Constitutional: Positive for malaise/fatigue. Negative for chills and fever.  Eyes: Negative for double vision.  Respiratory: Positive for shortness of breath.   Cardiovascular: Positive for chest pain and leg swelling.  Gastrointestinal: Negative for nausea and vomiting.  Genitourinary: Negative for dysuria.  Neurological: Positive for dizziness. Negative for headaches.    Blood pressure 134/69, pulse (!) 49, temperature 97.9 F (36.6 C), temperature source Oral, resp. rate 22, SpO2 92 %. Physical Exam  Constitutional: He is oriented to person, place, and time.  Eyes: Conjunctivae are normal. Left eye exhibits no discharge. No scleral icterus.  Neck: Normal range of motion. Neck supple. JVD present. No tracheal deviation present. No thyromegaly present.  Cardiovascular:  Irregularly irregular S1 and S2 soft there is 2/6 systolic murmur and S3 gallop  Respiratory:  Decreased breath sound at bases with bibasilar Rales  GI: Soft. He exhibits no distension. There is no tenderness. There is no rebound.  Musculoskeletal:  No clubbing cyanosis 3+ edema noted  Neurological: He is alert and oriented to person, place, and time.     Assessment/Plan Acute coronary syndrome Acute decompensated systolic heart failure Coronary artery disease status post CABG in 1983 subsequently had PCI to protected left main/left circumflex and saphenous vein graft to LAD in February 2016 Valvular heart disease Hypertension Hyperlipidemia Chronic A. fib chads score of 5 COPD Remote tobacco  abuse Acute on chronic kidney disease stage III History of kidney stones in the past Degenerative joint disease History of pancreatitis in the past Plan As per orders  Charolette Forward, MD 08/16/2016, 11:54 AM

## 2016-08-16 NOTE — Progress Notes (Signed)
CRITICAL VALUE ALERT  Critical value received:  Trop: 0.17  Date of notification:  08/16/2016  Time of notification:  1600  Critical value read back:Yes.    Nurse who received alert:  Clint Lipps, RN  MD notified (1st page):  Dr. Terrence Dupont  Time of first page:  1604  MD notified (2nd page):  Time of second page:  Responding MD:  Terrence Dupont  Time MD responded:  pending

## 2016-08-17 LAB — BASIC METABOLIC PANEL
ANION GAP: 11 (ref 5–15)
BUN: 41 mg/dL — ABNORMAL HIGH (ref 6–20)
CALCIUM: 8.9 mg/dL (ref 8.9–10.3)
CO2: 26 mmol/L (ref 22–32)
Chloride: 104 mmol/L (ref 101–111)
Creatinine, Ser: 2.07 mg/dL — ABNORMAL HIGH (ref 0.61–1.24)
GFR, EST AFRICAN AMERICAN: 31 mL/min — AB (ref >60)
GFR, EST NON AFRICAN AMERICAN: 27 mL/min — AB (ref >60)
Glucose, Bld: 87 mg/dL (ref 65–99)
Potassium: 4.3 mmol/L (ref 3.5–5.1)
Sodium: 141 mmol/L (ref 135–145)

## 2016-08-17 LAB — LIPID PANEL
CHOLESTEROL: 97 mg/dL (ref 0–200)
HDL: 30 mg/dL — AB (ref >40)
LDL Cholesterol: 55 mg/dL (ref 0–99)
TRIGLYCERIDES: 58 mg/dL (ref <150)
Total CHOL/HDL Ratio: 3.2 RATIO
VLDL: 12 mg/dL (ref 0–40)

## 2016-08-17 LAB — CBC
HEMATOCRIT: 34.3 % — AB (ref 39.0–52.0)
Hemoglobin: 10.4 g/dL — ABNORMAL LOW (ref 13.0–17.0)
MCH: 26.3 pg (ref 26.0–34.0)
MCHC: 30.3 g/dL (ref 30.0–36.0)
MCV: 86.6 fL (ref 78.0–100.0)
Platelets: 156 10*3/uL (ref 150–400)
RBC: 3.96 MIL/uL — ABNORMAL LOW (ref 4.22–5.81)
RDW: 18.5 % — AB (ref 11.5–15.5)
WBC: 5.8 10*3/uL (ref 4.0–10.5)

## 2016-08-17 LAB — TROPONIN I: TROPONIN I: 0.19 ng/mL — AB (ref <0.03)

## 2016-08-17 NOTE — Discharge Instructions (Signed)

## 2016-08-17 NOTE — Progress Notes (Signed)
Subjective:  No further chest pain.  States breathing has improved with IV Lasix, Celexa, swelling still persist  Objective:  Vital Signs in the last 24 hours: Temp:  [97.5 F (36.4 C)-97.8 F (36.6 C)] 97.8 F (36.6 C) (10/27 0342) Pulse Rate:  [49-51] 49 (10/27 0342) Resp:  [18-21] 20 (10/27 0342) BP: (123-138)/(77-83) 125/79 (10/27 0342) SpO2:  [91 %-98 %] 98 % (10/27 0342) Weight:  [197 lb 12.8 oz (89.7 kg)-200 lb 12.8 oz (91.1 kg)] 197 lb 12.8 oz (89.7 kg) (10/27 0342)  Intake/Output from previous day: 10/26 0701 - 10/27 0700 In: -  Out: 800 [Urine:800] Intake/Output from this shift: No intake/output data recorded.  Physical Exam: Neck: no adenopathy, no carotid bruit and supple, symmetrical, trachea midline Lungs: decreased breath sounds at bases with faint remnants Heart: irregularly irregular rhythm, S1, S2 normal and soft systolic murmur and an S3 gallop noted Abdomen: soft, non-tender; bowel sounds normal; no masses,  no organomegaly Extremities: no clubbing, cyanosis 3+ edema noted  Lab Results:  Recent Labs  08/16/16 0957 08/17/16 0140  WBC 6.1 5.8  HGB 11.3* 10.4*  PLT 170 156    Recent Labs  08/16/16 0957 08/17/16 0140  NA 139 141  K 4.7 4.3  CL 103 104  CO2 25 26  GLUCOSE 93 87  BUN 40* 41*  CREATININE 2.12* 2.07*    Recent Labs  08/16/16 1906 08/17/16 0140  TROPONINI 0.21* 0.19*   Hepatic Function Panel  Recent Labs  08/16/16 0957  PROT 7.3  ALBUMIN 3.9  AST 28  ALT 17  ALKPHOS 77  BILITOT 2.0*    Recent Labs  08/17/16 0140  CHOL 97   No results for input(s): PROTIME in the last 72 hours.  Imaging: Imaging results have been reviewed and Dg Chest Portable 1 View  Result Date: 08/16/2016 CLINICAL DATA:  Shortness of breath EXAM: PORTABLE CHEST 1 VIEW COMPARISON:  04/30/2016 FINDINGS: Cardiomegaly and pulmonary venous congestion. Limited visualization behind the heart is likely related to technique. EKG leads create  artifact over the chest. Status post CABG. No effusion or pneumothorax. IMPRESSION: Cardiomegaly and pulmonary venous congestion. Electronically Signed   By: Monte Fantasia M.D.   On: 08/16/2016 10:09    Cardiac Studies:  Assessment/Plan:  Acute coronary syndrome Acute decompensated systolic heart failure Coronary artery disease status post CABG in 1983 subsequently had PCI to protected left main/left circumflex and saphenous vein graft to LAD in February 2016 Valvular heart disease Hypertension Hyperlipidemia Chronic A. fib chads score of 5 COPD Remote tobacco abuse Acute on chronic kidney disease stage III History of kidney stones in the past Degenerative joint disease History of pancreatitis in the past Plan TED stockings. DNR. Medical management Discussed with patient and family and agrees  LOS: 1 day    Charolette Forward 08/17/2016, 11:59 AM

## 2016-08-17 NOTE — Progress Notes (Signed)
CCMD called to report pt had a 6 beat run of [place]. Upon assessment pt was sitting up in chair asleep. Appears to be asymptomatic at this time. Will continue to monitor.

## 2016-08-18 NOTE — Progress Notes (Signed)
Pharmacist Heart Failure Core Measure Documentation  Assessment: Gregg Hodges has an EF documented as 40% on 12/03/2014 by ECHO.  Rationale: Heart failure patients with left ventricular systolic dysfunction (LVSD) and an EF < 40% should be prescribed an angiotensin converting enzyme inhibitor (ACEI) or angiotensin receptor blocker (ARB) at discharge unless a contraindication is documented in the medical record.  This patient is not currently on an ACEI or ARB for HF.  This note is being placed in the record in order to provide documentation that a contraindication to the use of these agents is present for this encounter.  ACE Inhibitor or Angiotensin Receptor Blocker is contraindicated (specify all that apply)  []   ACEI allergy AND ARB allergy []   Angioedema []   Moderate or severe aortic stenosis []   Hyperkalemia []   Hypotension []   Renal artery stenosis [x]   Worsening renal function, preexisting renal disease or dysfunction   Gregg Hodges 08/18/2016 2:33 PM

## 2016-08-18 NOTE — Progress Notes (Signed)
Subjective:  Denies any chest pain states breathing is improving slowly leg swelling also slowly improving  Objective:  Vital Signs in the last 24 hours: Temp:  [97.3 F (36.3 C)-97.8 F (36.6 C)] 97.8 F (36.6 C) (10/28 0852) Pulse Rate:  [51-52] 52 (10/28 0441) Resp:  [20-22] 20 (10/28 0852) BP: (113-139)/(76-89) 136/84 (10/28 0852) SpO2:  [98 %-100 %] 100 % (10/28 0852) Weight:  [195 lb (88.5 kg)] 195 lb (88.5 kg) (10/28 0441)  Intake/Output from previous day: 10/27 0701 - 10/28 0700 In: -  Out: 2600 [Urine:2600] Intake/Output from this shift: No intake/output data recorded.  Physical Exam: Neck: no adenopathy, no carotid bruit, no JVD and supple, symmetrical, trachea midline Lungs: Decreased breath sound at bases with faint rales Heart: irregularly irregular rhythm, S1, S2 normal and Soft systolic murmur and S3 gallop noted Abdomen: irregularly irregular rhythm, S1, S2 normal and Soft systolic murmur and S3 gallop noted Extremities: No clubbing cyanosis 2+ edema noted  Lab Results:  Recent Labs  08/16/16 0957 08/17/16 0140  WBC 6.1 5.8  HGB 11.3* 10.4*  PLT 170 156    Recent Labs  08/16/16 0957 08/17/16 0140  NA 139 141  K 4.7 4.3  CL 103 104  CO2 25 26  GLUCOSE 93 87  BUN 40* 41*  CREATININE 2.12* 2.07*    Recent Labs  08/16/16 1906 08/17/16 0140  TROPONINI 0.21* 0.19*   Hepatic Function Panel  Recent Labs  08/16/16 0957  PROT 7.3  ALBUMIN 3.9  AST 28  ALT 17  ALKPHOS 77  BILITOT 2.0*    Recent Labs  08/17/16 0140  CHOL 97   No results for input(s): PROTIME in the last 72 hours.  Imaging: Imaging results have been reviewed and No results found.  Cardiac Studies:  Assessment/Plan:  Acute coronary syndrome Acute decompensated systolic heart failure Coronary artery disease status post CABG in 1983 subsequently had PCI to protected left main/left circumflex and saphenous vein graft to LAD in February 2016 Valvular heart  disease Hypertension Hyperlipidemia Chronic A. fib chads score of 5 COPD Remote tobacco abuse Acute on chronic kidney disease stage III History of kidney stones in the past Degenerative joint disease History of pancreatitis in the past Plan Continue present management Increase ambulation Possible discharge tomorrow if stable   LOS: 2 days    Charolette Forward 08/18/2016, 10:12 AM

## 2016-08-19 LAB — BASIC METABOLIC PANEL
Anion gap: 8 (ref 5–15)
BUN: 34 mg/dL — ABNORMAL HIGH (ref 6–20)
CALCIUM: 9 mg/dL (ref 8.9–10.3)
CO2: 29 mmol/L (ref 22–32)
CREATININE: 1.67 mg/dL — AB (ref 0.61–1.24)
Chloride: 104 mmol/L (ref 101–111)
GFR calc non Af Amer: 35 mL/min — ABNORMAL LOW (ref >60)
GFR, EST AFRICAN AMERICAN: 41 mL/min — AB (ref >60)
GLUCOSE: 91 mg/dL (ref 65–99)
Potassium: 4.5 mmol/L (ref 3.5–5.1)
Sodium: 141 mmol/L (ref 135–145)

## 2016-08-19 LAB — BRAIN NATRIURETIC PEPTIDE: B Natriuretic Peptide: 1658.8 pg/mL — ABNORMAL HIGH (ref 0.0–100.0)

## 2016-08-19 MED ORDER — FUROSEMIDE 10 MG/ML IJ SOLN
40.0000 mg | Freq: Once | INTRAMUSCULAR | Status: AC
  Administered 2016-08-19: 40 mg via INTRAVENOUS
  Filled 2016-08-19: qty 4

## 2016-08-19 MED ORDER — MORPHINE SULFATE (PF) 2 MG/ML IV SOLN
1.0000 mg | Freq: Once | INTRAVENOUS | Status: AC
  Administered 2016-08-19: 1 mg via INTRAVENOUS
  Filled 2016-08-19: qty 1

## 2016-08-19 MED ORDER — SPIRONOLACTONE 25 MG PO TABS: 12.5000 mg | ORAL_TABLET | Freq: Every day | ORAL | 3 refills | Status: DC

## 2016-08-19 NOTE — Care Management Note (Addendum)
Case Management Note  Patient Details  Name: Gregg Hodges MRN: [id number] Date of Birth: 1926/10/20  Subjective/Objective:                  Chest pain associated with shortness of breath and dizziness Action/Plan: Discharge planning Expected Discharge Date:                  Expected Discharge Plan:  [place]  In-House Referral:     Discharge planning Services  CM Consult  Post Acute Care Choice:  Home Health Choice offered to:  Patient  DME Arranged:  3-N-1, Walker rolling with seat DME Agency:  [place]:  PT, Social Work [employer] Agency:  [place]  Status of Service:  Completed, signed off  If discussed at [employer], dates discussed:    Additional Comments: CM met with pt in room and he is going home with daughter in law, [name] [address] [place], [place] [id number] for choice of home health agency.  Pt chooses [facility] to render HHPT/SW.  Referral called to [facility] rep, Jermaine with appropriate address.  CM notified [place] DME rep, Reggie to please deli ver the rollator and 3n1 to room prior to discharge today. No other CM needs were communicated. Dellie Catholic, RN 08/19/2016, 12:28 PM

## 2016-08-19 NOTE — Discharge Summary (Signed)
Discharge summary dictated on 08/19/2016 dictation number is [id number]

## 2016-08-19 NOTE — Progress Notes (Signed)
Pt c/o shortness of breath and insomnia related to anxiety this am at  0635 while sitting on side of bed.  Oxygen saturation on room air 97-100%.  Lungs to auscultation with bibasilar rales. Pt declines oxygen and ntg paste as ordered.  BNP elevated this am 1658.8.  Dr. Terrence Dupont notified with orders to administer am lasix now and give 1 mg IV morphine x 1.  Administered medications as ordered and provided pt with cornflakes and milk per pt request.  Pt states he feels better with medications.  Report given to Magdalene River, RN.

## 2016-08-20 NOTE — Discharge Summary (Signed)
NAMEARVID, DEGROOT                ACCOUNT NO.:  [id number]  MEDICAL RECORD NO.:  [id number]  LOCATION:  [id number]                        FACILITY:  [facility]  PHYSICIAN:  Jocee Kissick N. Terrence Dupont, M.D. DATE OF BIRTH:  1926-05-01  DATE OF ADMISSION:  08/16/2016 DATE OF DISCHARGE:  08/19/2016                              DISCHARGE SUMMARY   ADMITTING DIAGNOSES: 1. Acute coronary syndrome, rule out myocardial infarction. 2. Acute decompensated systolic congestive heart failure. 3. Coronary artery disease status post coronary artery bypass grafting     in 1983, subsequently had PCI to protected left main/left     circumflex and saphenous vein graft to LAD in February 2016. 4. Valvular heart disease. 5. Hypertension. 6. Hyperlipidemia. 7. Chronic atrial fibrillation, CHADS-VASc score of 5. 8. Chronic obstructive pulmonary disease. 9. Remote tobacco abuse. 10.Acute on chronic kidney disease, stage 3. 11.History of kidney stone in the past. 12.Degenerative joint disease. 13.History of pancreatitis in the past.  FINAL DIAGNOSES: 1. Probable very small non-Q-wave myocardial infarction. 2. Resolving acute on chronic decompensated systolic congestive heart     failure. 3. Coronary artery disease status post coronary artery bypass grafting     in 1983, subsequently had PCI to protected left main/left     circumflex and saphenous vein graft to LAD in February 2016. 4. Valvular heart disease. 5. Hypertension. 6. Hyperlipidemia. 7. Chronic atrial fibrillation with slow ventricular response. CHADS-     VASc score of 5. 8. Chronic obstructive pulmonary disease. 9. Remote tobacco abuse. 10.Chronic kidney disease, stage 3. 11.History of kidney stones. 12.Degenerative joint disease. 13.History of pancreatitis in the past.  DISCHARGE MEDICATIONS: 1. Spironolactone 12.5 mg daily. 2. Tylenol 650 mg twice daily as needed. 3. Eliquis 2.5 mg twice daily. 4. Atorvastatin 40 mg daily. 5. Clopidogrel 75 mg  half tablet daily. 6. Azopt tear drops as before. 7. Isosorbide mononitrate 20 mg 3 times daily. 8. Nitrostat 0.4 mg sublingual use as directed. 9. Omeprazole 20 mg daily. 10.Lasix 40 mg twice daily. The patient has been advised to stop tramadol and hydralazine.  DIET:  Low-salt, low-cholesterol, 1800 calories ADA diet.  Heart failure instructions have been given.  FOLLOWUP:  Follow up with me in 1 week.  CONDITION AT DISCHARGE:  Stable.  BRIEF HISTORY AND HOSPITAL COURSE:  Mr. Moscatello is an 82 year old male with past medical history significant for coronary artery disease, status post CABG in 1983 approximately 34 years ago, status post PTCA stenting to protected left main/left circumflex and saphenous vein graft to LAD in February 2016; hypertension; chronic atrial fibrillation, on chronic anticoagulation, CHADS-VASc score of 5; COPD; degenerative joint disease; history of abdominal aortic aneurysm, status post repair in the past; remote tobacco abuse; chronic kidney disease, stage 3/4; history of congestive heart failure secondary to depressed LV systolic function; valvular heart disease.  He came to the ER by EMS complaining of retrosternal chest pain described as tightness, rated 8 to 9/10 associated with shortness of breath and dizziness.  He took 2 sublingual nitro with partial relief, so called EMS, received 3 more sublingual nitro and 325 mg of aspirin with relief of chest pain.  The patient denies any syncopal episode.  Denies any  history of PND or orthopnea, but complains of progressive leg swelling.  PHYSICAL EXAMINATION:  GENERAL:  He was alert, awake, and oriented x3, in no acute distress. VITAL SIGNS:  Blood pressure was 134/69, pulse 49, irregularly irregular. HEENT:  Conjunctivae pink. NECK:  Supple.  Positive JVD. LUNGS:  Decreased breath sounds at bases with bibasilar rales. CARDIOVASCULAR:  Irregularly irregular.  S1-S2 was soft.  There was 2/6 systolic  murmur and S3 gallop. ABDOMEN:  Soft.  Bowel sounds present. EXTREMITIES:  There was no clubbing or cyanosis.  There was 3+ edema.  LABORATORY DATA:  Sodium was 139, potassium 4.7, BUN 40, creatinine 2.12.  BNP was 1383.  Hemoglobin was 11.3, hematocrit 36.3, white count of 6.1.  His troponin-I was slightly elevated 0.17, 0.21, 0.19, which is trending down.  Cholesterol was 97, triglycerides 58, HDL was low 30, LDL was 55.  BRIEF HOSPITAL COURSE:  The patient was admitted to telemetry unit.  The patient ruled in for a very small non-Q-wave myocardial infarction due to elevated enzymes and chest pain.  I discussed with family at length regarding various options of treatment, and they agreed for conservative medical management only.  The patient's renal function also improved. Last BUN was 34, creatinine 1.67.  I discussed with the patient and his family regarding short-term rehab/nursing home, but patient wishes to stay with family as outpatient and refuse for nursing home assessment.  The patient will be discharged home on above medications and will be followed up by me in 1 week.  We will arrange for home health aide and PT and social worker evaluation.     [name]. Terrence Dupont, M.D.     MNH/MEDQ  D:  08/19/2016  T:  08/20/2016  Job:  [id number]

## 2016-08-25 ENCOUNTER — Emergency Department (HOSPITAL_COMMUNITY)
Admission: EM | Admit: 2016-08-25 | Discharge: 2016-08-25 | Disposition: A | Discharge status: Home / Self Care | Payer: Medicare Other | Attending: Physician Assistant | Admitting: Physician Assistant

## 2016-08-25 ENCOUNTER — Encounter (HOSPITAL_COMMUNITY): Payer: Self-pay | Admitting: Nurse Practitioner

## 2016-08-25 DIAGNOSIS — I5032 Chronic diastolic (congestive) heart failure: Secondary | ICD-10-CM | POA: Insufficient documentation

## 2016-08-25 DIAGNOSIS — N3001 Acute cystitis with hematuria: Secondary | ICD-10-CM | POA: Diagnosis not present

## 2016-08-25 DIAGNOSIS — Z85818 Personal history of malignant neoplasm of other sites of lip, oral cavity, and pharynx: Secondary | ICD-10-CM | POA: Insufficient documentation

## 2016-08-25 DIAGNOSIS — I252 Old myocardial infarction: Secondary | ICD-10-CM | POA: Insufficient documentation

## 2016-08-25 DIAGNOSIS — Z7901 Long term (current) use of anticoagulants: Secondary | ICD-10-CM | POA: Diagnosis not present

## 2016-08-25 DIAGNOSIS — I130 Hypertensive heart and chronic kidney disease with heart failure and stage 1 through stage 4 chronic kidney disease, or unspecified chronic kidney disease: Secondary | ICD-10-CM | POA: Insufficient documentation

## 2016-08-25 DIAGNOSIS — Z79899 Other long term (current) drug therapy: Secondary | ICD-10-CM | POA: Diagnosis not present

## 2016-08-25 DIAGNOSIS — Z87891 Personal history of nicotine dependence: Secondary | ICD-10-CM | POA: Insufficient documentation

## 2016-08-25 DIAGNOSIS — Z96653 Presence of artificial knee joint, bilateral: Secondary | ICD-10-CM | POA: Diagnosis not present

## 2016-08-25 DIAGNOSIS — R300 Dysuria: Secondary | ICD-10-CM | POA: Diagnosis present

## 2016-08-25 DIAGNOSIS — N183 Chronic kidney disease, stage 3 (moderate): Secondary | ICD-10-CM | POA: Insufficient documentation

## 2016-08-25 DIAGNOSIS — Z951 Presence of aortocoronary bypass graft: Secondary | ICD-10-CM | POA: Insufficient documentation

## 2016-08-25 LAB — CBC WITH DIFFERENTIAL/PLATELET
BASOS PCT: 0 %
Basophils Absolute: 0 10*3/uL (ref 0.0–0.1)
Eosinophils Absolute: 0.3 10*3/uL (ref 0.0–0.7)
Eosinophils Relative: 2 %
HEMATOCRIT: 34.5 % — AB (ref 39.0–52.0)
Hemoglobin: 10.6 g/dL — ABNORMAL LOW (ref 13.0–17.0)
Lymphocytes Relative: 9 %
Lymphs Abs: 1.2 10*3/uL (ref 0.7–4.0)
MCH: 27.2 pg (ref 26.0–34.0)
MCHC: 30.7 g/dL (ref 30.0–36.0)
MCV: 88.5 fL (ref 78.0–100.0)
MONO ABS: 1 10*3/uL (ref 0.1–1.0)
MONOS PCT: 8 %
NEUTROS ABS: 10.7 10*3/uL — AB (ref 1.7–7.7)
Neutrophils Relative %: 81 %
Platelets: 147 10*3/uL — ABNORMAL LOW (ref 150–400)
RBC: 3.9 MIL/uL — ABNORMAL LOW (ref 4.22–5.81)
RDW: 19.4 % — AB (ref 11.5–15.5)
WBC: 13.2 10*3/uL — ABNORMAL HIGH (ref 4.0–10.5)

## 2016-08-25 LAB — URINALYSIS, ROUTINE W REFLEX MICROSCOPIC
BILIRUBIN URINE: NEGATIVE
Glucose, UA: NEGATIVE mg/dL
Ketones, ur: NEGATIVE mg/dL
NITRITE: NEGATIVE
Protein, ur: 30 mg/dL — AB
SPECIFIC GRAVITY, URINE: 1.015 (ref 1.005–1.030)
pH: 6 (ref 5.0–8.0)

## 2016-08-25 LAB — COMPREHENSIVE METABOLIC PANEL
ALBUMIN: 3.5 g/dL (ref 3.5–5.0)
ALK PHOS: 77 U/L (ref 38–126)
ALT: 18 U/L (ref 17–63)
ANION GAP: 7 (ref 5–15)
AST: 25 U/L (ref 15–41)
BUN: 45 mg/dL — ABNORMAL HIGH (ref 6–20)
CALCIUM: 8.9 mg/dL (ref 8.9–10.3)
CO2: 25 mmol/L (ref 22–32)
Chloride: 106 mmol/L (ref 101–111)
Creatinine, Ser: 2.04 mg/dL — ABNORMAL HIGH (ref 0.61–1.24)
GFR calc Af Amer: 32 mL/min — ABNORMAL LOW (ref >60)
GFR calc non Af Amer: 27 mL/min — ABNORMAL LOW (ref >60)
GLUCOSE: 94 mg/dL (ref 65–99)
Potassium: 5 mmol/L (ref 3.5–5.1)
SODIUM: 138 mmol/L (ref 135–145)
Total Bilirubin: 1.4 mg/dL — ABNORMAL HIGH (ref 0.3–1.2)
Total Protein: 6.7 g/dL (ref 6.5–8.1)

## 2016-08-25 LAB — URINE MICROSCOPIC-ADD ON

## 2016-08-25 MED ORDER — CEPHALEXIN 250 MG PO CAPS
500.0000 mg | ORAL_CAPSULE | Freq: Once | ORAL | Status: AC
  Administered 2016-08-25: 500 mg via ORAL
  Filled 2016-08-25: qty 2

## 2016-08-25 MED ORDER — CEPHALEXIN 500 MG PO CAPS: 500.0000 mg | ORAL_CAPSULE | Freq: Four times a day (QID) | ORAL | 0 refills | Status: DC

## 2016-08-25 NOTE — ED Triage Notes (Addendum)
Pt presents with c/o urinary symptoms. The symptoms began 3 days ago. He c/o dysuria, urinary frequency, decreased urinary stream, urinary incontinence. He reports nausea. He denies fevers, chills, abd pain, flank pain, constipation, diarrhea. He was discharged from hospital this past Sunday after an admission for CHF,  but was not catheterized during his stay. He is alert and breathing easily

## 2016-08-25 NOTE — ED Notes (Signed)
Pt is in stable condition upon d/c and is escorted from ED via wheelchair. 

## 2016-08-25 NOTE — ED Notes (Signed)
Patient brought back to room via wheelchair; patient undressed, in gown, on monitor, continuous pulse oximetry and blood pressure cuff; patient handed an urinal for patient is aware of need of urine specimen

## 2016-08-25 NOTE — ED Provider Notes (Signed)
[facility] DEPT Provider Note   CSN: [id number] Arrival date & time: 08/25/16  1152     History   Chief Complaint Chief Complaint  Patient presents with  . Dysuria    HPI Gregg Hodges is a 82 y.o. male.  HPI   Patient is an 82 year old male presenting with dysuria. Patient is had a recent hospital stay for CHF. Patient noted only to have one functional kidney. Patient's family member worried that he might have a kidney infection. Patient has no fever, no change in appetite, no pain. Isolated dysuria with dark colored urine.  According to the lower patient's been aggressively diuresed since discharge.  Past Medical History:  Diagnosis Date  . Anginal pain ([place])   . Arthritis    "all over" (04/26/2016)  . Cancer ([place])    Malignant adnoid carcinoma (roof of mouth)  . CHF (congestive heart failure) ([place])   . Chronic back pain   . CKD (chronic kidney disease), stage III   . Complication of anesthesia    only to ether  . Coronary artery disease   . Foot drop, right    "had aneurysm behind my knee; foot corrected itself"  . GERD (gastroesophageal reflux disease)   . Heart murmur   . High cholesterol   . Hypertension   . Insomnia   . Myocardial infarction 11/2014  . Renal disorder    kidney stones, resulted in blockage of L kidney, later removed due to damage  . Shortness of breath dyspnea     Patient Active Problem List   Diagnosis Date Noted  . CHF (congestive heart failure) ([place]) 04/26/2016  . Unstable angina ([place]) 03/06/2016  . Acute coronary syndrome ([place]) 12/02/2014  . Choledocholithiasis 02/25/2013  . Pancreatitis, acute 02/23/2013  . Acute on chronic renal failure ([place]) 02/23/2013  . Transaminitis 02/23/2013  . CAD, ARTERY BYPASS GRAFT 01/05/2010  . CLAUDICATION 01/05/2010  . CHEST PAIN 01/05/2010  . CORONARY ARTERY DISEASE 12/22/2009  . ATRIAL FIBRILLATION 12/22/2009  . Chronic diastolic heart failure ([place]) 12/22/2009    Past Surgical History:    Procedure Laterality Date  . ABDOMINAL AORTIC ANEURYSM REPAIR    . CHOLECYSTECTOMY    . CORONARY ARTERY BYPASS GRAFT  1983   "CABG X5"  . ERCP N/A 02/25/2013   Procedure: ENDOSCOPIC RETROGRADE CHOLANGIOPANCREATOGRAPHY (ERCP);  Surgeon: Jeryl Columbia, MD;  Location: [facility];  Service: Endoscopy;  Laterality: N/A;  . ESOPHAGOGASTRODUODENOSCOPY    . EXCISIONAL HEMORRHOIDECTOMY    . INGUINAL HERNIA REPAIR Right   . JOINT REPLACEMENT    . LEFT HEART CATHETERIZATION WITH CORONARY/GRAFT ANGIOGRAM N/A 12/02/2014   Procedure: LEFT HEART CATHETERIZATION WITH Beatrix Fetters;  Surgeon: Clent Demark, MD;  Location: [facility] CATH LAB;  Service: Cardiovascular;  Laterality: N/A;  . SKIN CANCER EXCISION  ~ 2000   "roof of my mouth; ~ size of quarter; benign; [place]"  . TONSILLECTOMY    . TOTAL KNEE ARTHROPLASTY Bilateral        Home Medications    Prior to Admission medications   Medication Sig Start Date End Date Taking? Authorizing Provider  acetaminophen (TYLENOL) 325 MG tablet Take 650 mg by mouth 2 (two) times daily.   Yes Historical Provider, MD  apixaban (ELIQUIS) 2.5 MG TABS tablet Take 1 tablet (2.5 mg total) by mouth 2 (two) times daily. 03/07/16  Yes Charolette Forward, MD  atorvastatin (LIPITOR) 40 MG tablet Take 1 tablet (40 mg total) by mouth daily at 6 PM. 05/01/16  Yes Charolette Forward, MD  clopidogrel (PLAVIX) 75 MG tablet Take 0.5 tablets (37.5 mg total) by mouth daily with breakfast. 03/07/16  Yes Charolette Forward, MD  furosemide (LASIX) 40 MG tablet Take 1 tablet (40 mg total) by mouth daily. Patient taking differently: Take 40 mg by mouth 2 (two) times daily. 40 mg in the morning and 40 mg at noon 05/01/16  Yes Charolette Forward, MD  hydroxypropyl methylcellulose / hypromellose (ISOPTO TEARS / GONIOVISC) 2.5 % ophthalmic solution Place 1 drop into both eyes at bedtime.   Yes Historical Provider, MD  isosorbide dinitrate (ISORDIL) 20 MG tablet Take 20 mg by mouth 3 (three) times daily.     Yes Historical Provider, MD  nitroGLYCERIN (NITROSTAT) 0.4 MG SL tablet Place 0.4 mg under the tongue every 5 (five) minutes as needed for chest pain.   Yes Historical Provider, MD  omeprazole (PRILOSEC) 20 MG capsule Take 20 mg by mouth every morning.    Yes Historical Provider, MD  spironolactone (ALDACTONE) 25 MG tablet Take 0.5 tablets (12.5 mg total) by mouth daily. Patient taking differently: Take 12.5 mg by mouth every morning.  08/19/16  Yes Charolette Forward, MD  cephALEXin (KEFLEX) 500 MG capsule Take 1 capsule (500 mg total) by mouth 4 (four) times daily. 08/25/16   Mariluz Crespo Lyn Brytani Voth, MD    Family History History reviewed. No pertinent family history.  Social History Social History  Substance Use Topics  . Smoking status: Former Smoker    Packs/day: 0.50    Years: 24.00    Types: Cigarettes    Quit date: 02/24/1967  . Smokeless tobacco: Never Used  . Alcohol use No     Allergies   Isosorbide mononitrate; Aspirin; and Ether   Review of Systems Review of Systems  Constitutional: Negative for fatigue and fever.  Gastrointestinal: Negative for abdominal pain.  Genitourinary: Positive for decreased urine volume and dysuria. Negative for flank pain.  All other systems reviewed and are negative.    Physical Exam Updated Vital Signs BP 140/88   Pulse (!) 49   Temp 98.4 F (36.9 C) (Oral)   Resp 18   Ht 6\' 1"  (1.854 m)   Wt 188 lb 8 oz (85.5 kg)   SpO2 100%   BMI 24.87 kg/m   Physical Exam  Constitutional: He is oriented to person, place, and time. He appears well-nourished.  HENT:  Head: Normocephalic.  Eyes: Conjunctivae are normal.  Cardiovascular: Normal rate and regular rhythm.   Pulmonary/Chest: Effort normal and breath sounds normal. No respiratory distress. He has no wheezes. He has no rales.  Abdominal: There is no tenderness.  Neurological: He is oriented to person, place, and time.  Skin: Skin is warm and dry. He is not diaphoretic.  Psychiatric:  He has a normal mood and affect. His behavior is normal.     ED Treatments / Results  Labs (all labs ordered are listed, but only abnormal results are displayed) Labs Reviewed  URINALYSIS, ROUTINE W REFLEX MICROSCOPIC (NOT AT [facility]) - Abnormal; Notable for the following:       Result Value   APPearance HAZY (*)    Hgb urine dipstick MODERATE (*)    Protein, ur 30 (*)    Leukocytes, UA LARGE (*)    All other components within normal limits  COMPREHENSIVE METABOLIC PANEL - Abnormal; Notable for the following:    BUN 45 (*)    Creatinine, Ser 2.04 (*)    Total Bilirubin 1.4 (*)  GFR calc non Af Amer 27 (*)    GFR calc Af Amer 32 (*)    All other components within normal limits  CBC WITH DIFFERENTIAL/PLATELET - Abnormal; Notable for the following:    WBC 13.2 (*)    RBC 3.90 (*)    Hemoglobin 10.6 (*)    HCT 34.5 (*)    RDW 19.4 (*)    Platelets 147 (*)    Neutro Abs 10.7 (*)    All other components within normal limits  URINE MICROSCOPIC-ADD ON - Abnormal; Notable for the following:    Squamous Epithelial / LPF 0-5 (*)    Bacteria, UA FEW (*)    Casts HYALINE CASTS (*)    All other components within normal limits  URINE CULTURE    EKG  EKG Interpretation None       Radiology No results found.  Procedures Procedures (including critical care time)  Medications Ordered in ED Medications  cephALEXin (KEFLEX) capsule 500 mg (500 mg Oral Given 08/25/16 1410)     Initial Impression / Assessment and Plan / ED Course  I have reviewed the triage vital signs and the nursing notes.  Pertinent labs & imaging results that were available during my care of the patient were reviewed by me and considered in my medical decision making (see chart for details).  Clinical Course   Patient is a pleasant 82 year old male with past medical history of CHF, ACS, CKD, A. fib presenting today with dysuria and dark-colored urine. Patient's family member concerned that he might be  having a kidney infection. No fevers or some associated systemic symptoms. Will get urine.  Will plan to get labs given that he has been actively being diuresed and has potential infection on top of this.  UA shows infection. Cr is around baslien (no AKI technically) will have patient follow up with cards on Monday as planned, take abx. Return precuations expressed and understood.  Final Clinical Impressions(s) / ED Diagnoses   Final diagnoses:  Acute cystitis with hematuria    New Prescriptions Discharge Medication List as of 08/25/2016  2:14 PM    START taking these medications   Details  cephALEXin (KEFLEX) 500 MG capsule Take 1 capsule (500 mg total) by mouth 4 (four) times daily., Starting Sat 08/25/2016, Print         Jacobie Stamey Julio Alm, MD 08/25/16 1622

## 2016-08-25 NOTE — Discharge Instructions (Signed)
You have found to have a urinary tract infection. Please take abx properly. Please return if you have any abdominal pain, fever, nausea, vomiting or any other concerns.

## 2016-08-27 LAB — URINE CULTURE: Culture: 50000 — AB

## 2016-08-28 NOTE — Progress Notes (Signed)
ED Antimicrobial Stewardship Positive Culture Follow Up   DELDRICK Hodges is an 82 y.o. male who presented to [facility] on 08/25/2016 with a chief complaint of  Chief Complaint  Patient presents with  . Dysuria    Recent Results (from the past 720 hour(s))  Urine culture     Status: Abnormal   Collection Time: 08/25/16 12:35 PM  Result Value Ref Range Status   Specimen Description URINE, RANDOM  Final   Special Requests NONE  Final   Culture 50,000 COLONIES/mL STAPHYLOCOCCUS AUREUS (A)  Final   Report Status 08/27/2016 FINAL  Final   Organism ID, Bacteria STAPHYLOCOCCUS AUREUS (A)  Final      Susceptibility   Staphylococcus aureus - MIC*    CIPROFLOXACIN <=0.5 SENSITIVE Sensitive     GENTAMICIN <=0.5 SENSITIVE Sensitive     NITROFURANTOIN <=16 SENSITIVE Sensitive     OXACILLIN 0.5 SENSITIVE Sensitive     TETRACYCLINE <=1 SENSITIVE Sensitive     VANCOMYCIN 1 SENSITIVE Sensitive     TRIMETH/SULFA <=10 SENSITIVE Sensitive     CLINDAMYCIN <=0.25 SENSITIVE Sensitive     RIFAMPIN <=0.5 SENSITIVE Sensitive     Inducible Clindamycin NEGATIVE Sensitive     * 50,000 COLONIES/mL STAPHYLOCOCCUS AUREUS    [x]  Called patient and he stated that he is much improved. He followed up with his PCP, Dr. Terrence Dupont, yesterday. During that visit, "bloodwork" was taken and antibiotics were changed, although he could not state what the new antibiotic plan is. PCP to continue to follow.    Belia Heman, PharmD PGY1 Pharmacy Resident [phone] (Pager) 08/28/2016 9:01 AM

## 2016-08-29 ENCOUNTER — Ambulatory Visit (INDEPENDENT_AMBULATORY_CARE_PROVIDER_SITE_OTHER): Payer: Medicare Other | Admitting: Podiatry

## 2016-08-29 ENCOUNTER — Encounter: Payer: Self-pay | Admitting: Podiatry

## 2016-08-29 DIAGNOSIS — B351 Tinea unguium: Secondary | ICD-10-CM

## 2016-08-29 DIAGNOSIS — M79676 Pain in unspecified toe(s): Secondary | ICD-10-CM

## 2016-08-29 NOTE — Progress Notes (Signed)
   Subjective:    Patient ID: Gregg Hodges, male    DOB: Jan 12, 1926, 82 y.o.   MRN: [id number]  HPI This patient presents to the office with long thick nails.  Nails atre painful walking and wearing his shoes.  Patient has CHF and is on blood thyinners.  He presents for preventive foot care services.    Review of Systems  All other systems reviewed and are negative.      Objective:   Physical Exam GENERAL APPEARANCE: Alert, conversant. Appropriately groomed. No acute distress.  VASCULAR: Pedal pulses are  palpable at  DP.    PT are  non palpable bilateral.  Capillary refill time is immediate to all digits,  Normal temperature gradient.  D NEUROLOGIC: sensation is normal to 5.07 monofilament at 5/5 sites bilateral.  Light touch is intact bilateral, Muscle strength normal.  MUSCULOSKELETAL: acceptable muscle strength, tone and stability bilateral.  Intrinsic muscluature intact bilateral.  Rectus appearance of foot and digits noted bilateral. Severe swelling  B/L  DERMATOLOGIC: skin color, texture, and turgor are within normal limits.  No preulcerative lesions or ulcers  are seen, no interdigital maceration noted.  No open lesions present.   No drainage noted.  NAILS  Thick disfigured discolored nails both feet.                                     Assessment & Plan:  Diagnosis  Onychomycosis  B/L  Treatment    Debridement of nails     RTC 10 weeks.   Gardiner Barefoot DPM

## 2016-10-31 ENCOUNTER — Encounter: Payer: Self-pay | Admitting: Podiatry

## 2016-10-31 ENCOUNTER — Ambulatory Visit (INDEPENDENT_AMBULATORY_CARE_PROVIDER_SITE_OTHER): Payer: Medicare Other | Admitting: Podiatry

## 2016-10-31 VITALS — Resp 18 | Ht 73.0 in | Wt 188.0 lb

## 2016-10-31 DIAGNOSIS — M79676 Pain in unspecified toe(s): Secondary | ICD-10-CM

## 2016-10-31 DIAGNOSIS — B351 Tinea unguium: Secondary | ICD-10-CM

## 2016-10-31 NOTE — Progress Notes (Signed)
   Subjective:    Patient ID: Gregg Hodges, male    DOB: 07-Sep-1926, 83 y.o.   MRN: [id number]  HPI This patient presents to the office with long thick nails.  Nails atre painful walking and wearing his shoes.  Patient has CHF and is on blood thyinners.  He presents for preventive foot care services.    Review of Systems  All other systems reviewed and are negative.      Objective:   Physical Exam GENERAL APPEARANCE: Alert, conversant. Appropriately groomed. No acute distress.  VASCULAR: Pedal pulses are  palpable at  DP.    PT are  non palpable bilateral.  Capillary refill time is immediate to all digits,  Normal temperature gradient.  D NEUROLOGIC: sensation is normal to 5.07 monofilament at 5/5 sites bilateral.  Light touch is intact bilateral, Muscle strength normal.  MUSCULOSKELETAL: acceptable muscle strength, tone and stability bilateral.  Intrinsic muscluature intact bilateral.  Rectus appearance of foot and digits noted bilateral. Severe swelling  B/L  DERMATOLOGIC: skin color, texture, and turgor are within normal limits.  No preulcerative lesions or ulcers  are seen, no interdigital maceration noted.  No open lesions present.   No drainage noted.  NAILS  Thick disfigured discolored nails both feet.                                     Assessment & Plan:  Diagnosis  Onychomycosis  B/L  Treatment    Debridement of nails     RTC 10 weeks.   Gardiner Barefoot DPM

## 2016-11-17 ENCOUNTER — Encounter (HOSPITAL_COMMUNITY): Payer: Self-pay | Admitting: Nurse Practitioner

## 2016-11-17 ENCOUNTER — Emergency Department (HOSPITAL_COMMUNITY): Payer: Medicare Other

## 2016-11-17 DIAGNOSIS — G8929 Other chronic pain: Secondary | ICD-10-CM | POA: Diagnosis present

## 2016-11-17 DIAGNOSIS — R0602 Shortness of breath: Secondary | ICD-10-CM | POA: Diagnosis present

## 2016-11-17 DIAGNOSIS — I252 Old myocardial infarction: Secondary | ICD-10-CM

## 2016-11-17 DIAGNOSIS — I2510 Atherosclerotic heart disease of native coronary artery without angina pectoris: Secondary | ICD-10-CM | POA: Diagnosis present

## 2016-11-17 DIAGNOSIS — M21371 Foot drop, right foot: Secondary | ICD-10-CM | POA: Diagnosis present

## 2016-11-17 DIAGNOSIS — Z951 Presence of aortocoronary bypass graft: Secondary | ICD-10-CM

## 2016-11-17 DIAGNOSIS — Z888 Allergy status to other drugs, medicaments and biological substances status: Secondary | ICD-10-CM

## 2016-11-17 DIAGNOSIS — E785 Hyperlipidemia, unspecified: Secondary | ICD-10-CM | POA: Diagnosis present

## 2016-11-17 DIAGNOSIS — N183 Chronic kidney disease, stage 3 (moderate): Secondary | ICD-10-CM | POA: Diagnosis present

## 2016-11-17 DIAGNOSIS — M549 Dorsalgia, unspecified: Secondary | ICD-10-CM | POA: Diagnosis present

## 2016-11-17 DIAGNOSIS — I5021 Acute systolic (congestive) heart failure: Secondary | ICD-10-CM | POA: Diagnosis present

## 2016-11-17 DIAGNOSIS — K219 Gastro-esophageal reflux disease without esophagitis: Secondary | ICD-10-CM | POA: Diagnosis present

## 2016-11-17 DIAGNOSIS — Z9049 Acquired absence of other specified parts of digestive tract: Secondary | ICD-10-CM | POA: Diagnosis not present

## 2016-11-17 DIAGNOSIS — Z96653 Presence of artificial knee joint, bilateral: Secondary | ICD-10-CM | POA: Diagnosis present

## 2016-11-17 DIAGNOSIS — I482 Chronic atrial fibrillation: Secondary | ICD-10-CM | POA: Diagnosis present

## 2016-11-17 DIAGNOSIS — I509 Heart failure, unspecified: Secondary | ICD-10-CM

## 2016-11-17 DIAGNOSIS — Z66 Do not resuscitate: Secondary | ICD-10-CM | POA: Diagnosis present

## 2016-11-17 DIAGNOSIS — I130 Hypertensive heart and chronic kidney disease with heart failure and stage 1 through stage 4 chronic kidney disease, or unspecified chronic kidney disease: Secondary | ICD-10-CM | POA: Diagnosis present

## 2016-11-17 DIAGNOSIS — E7800 Pure hypercholesterolemia, unspecified: Secondary | ICD-10-CM | POA: Diagnosis present

## 2016-11-17 DIAGNOSIS — I469 Cardiac arrest, cause unspecified: Secondary | ICD-10-CM | POA: Diagnosis not present

## 2016-11-17 DIAGNOSIS — Z886 Allergy status to analgesic agent status: Secondary | ICD-10-CM

## 2016-11-17 DIAGNOSIS — R3129 Other microscopic hematuria: Secondary | ICD-10-CM | POA: Diagnosis present

## 2016-11-17 DIAGNOSIS — I214 Non-ST elevation (NSTEMI) myocardial infarction: Principal | ICD-10-CM | POA: Diagnosis present

## 2016-11-17 DIAGNOSIS — M1990 Unspecified osteoarthritis, unspecified site: Secondary | ICD-10-CM | POA: Diagnosis present

## 2016-11-17 DIAGNOSIS — Z87891 Personal history of nicotine dependence: Secondary | ICD-10-CM

## 2016-11-17 LAB — CBC
HCT: 36.7 % — ABNORMAL LOW (ref 39.0–52.0)
Hemoglobin: 11.1 g/dL — ABNORMAL LOW (ref 13.0–17.0)
MCH: 26.3 pg (ref 26.0–34.0)
MCHC: 30.2 g/dL (ref 30.0–36.0)
MCV: 87 fL (ref 78.0–100.0)
PLATELETS: 204 10*3/uL (ref 150–400)
RBC: 4.22 MIL/uL (ref 4.22–5.81)
RDW: 16.6 % — AB (ref 11.5–15.5)
WBC: 7.6 10*3/uL (ref 4.0–10.5)

## 2016-11-17 LAB — I-STAT ARTERIAL BLOOD GAS, ED
Acid-Base Excess: 3 mmol/L — ABNORMAL HIGH (ref 0.0–2.0)
Bicarbonate: 27.7 mmol/L (ref 20.0–28.0)
O2 SAT: 95 %
TCO2: 29 mmol/L (ref 0–100)
pCO2 arterial: 42.5 mmHg (ref 32.0–48.0)
pH, Arterial: 7.422 (ref 7.350–7.450)
pO2, Arterial: 76 mmHg — ABNORMAL LOW (ref 83.0–108.0)

## 2016-11-17 LAB — URINALYSIS, ROUTINE W REFLEX MICROSCOPIC
BILIRUBIN URINE: NEGATIVE
Bacteria, UA: NONE SEEN
Glucose, UA: NEGATIVE mg/dL
Ketones, ur: NEGATIVE mg/dL
LEUKOCYTES UA: NEGATIVE
NITRITE: NEGATIVE
PH: 7 (ref 5.0–8.0)
Protein, ur: NEGATIVE mg/dL
SPECIFIC GRAVITY, URINE: 1.011 (ref 1.005–1.030)
SQUAMOUS EPITHELIAL / LPF: NONE SEEN

## 2016-11-17 LAB — BRAIN NATRIURETIC PEPTIDE: B NATRIURETIC PEPTIDE 5: 451.2 pg/mL — AB (ref 0.0–100.0)

## 2016-11-17 LAB — CBC WITH DIFFERENTIAL/PLATELET
BASOS PCT: 1 %
Basophils Absolute: 0.1 10*3/uL (ref 0.0–0.1)
EOS ABS: 0.8 10*3/uL — AB (ref 0.0–0.7)
Eosinophils Relative: 10 %
HCT: 38.1 % — ABNORMAL LOW (ref 39.0–52.0)
HEMOGLOBIN: 11.4 g/dL — AB (ref 13.0–17.0)
Lymphocytes Relative: 13 %
Lymphs Abs: 1 10*3/uL (ref 0.7–4.0)
MCH: 26.1 pg (ref 26.0–34.0)
MCHC: 29.9 g/dL — AB (ref 30.0–36.0)
MCV: 87.2 fL (ref 78.0–100.0)
Monocytes Absolute: 0.6 10*3/uL (ref 0.1–1.0)
Monocytes Relative: 8 %
NEUTROS PCT: 68 %
Neutro Abs: 5.6 10*3/uL (ref 1.7–7.7)
PLATELETS: 219 10*3/uL (ref 150–400)
RBC: 4.37 MIL/uL (ref 4.22–5.81)
RDW: 17.1 % — ABNORMAL HIGH (ref 11.5–15.5)
WBC: 8.2 10*3/uL (ref 4.0–10.5)

## 2016-11-17 LAB — CREATININE, SERUM
Creatinine, Ser: 1.69 mg/dL — ABNORMAL HIGH (ref 0.61–1.24)
GFR calc Af Amer: 40 mL/min — ABNORMAL LOW (ref >60)
GFR calc non Af Amer: 34 mL/min — ABNORMAL LOW (ref >60)

## 2016-11-17 LAB — COMPREHENSIVE METABOLIC PANEL
ALK PHOS: 93 U/L (ref 38–126)
ALT: 15 U/L — ABNORMAL LOW (ref 17–63)
ANION GAP: 10 (ref 5–15)
AST: 22 U/L (ref 15–41)
Albumin: 3.3 g/dL — ABNORMAL LOW (ref 3.5–5.0)
BILIRUBIN TOTAL: 0.7 mg/dL (ref 0.3–1.2)
BUN: 33 mg/dL — ABNORMAL HIGH (ref 6–20)
CALCIUM: 8.7 mg/dL — AB (ref 8.9–10.3)
CO2: 25 mmol/L (ref 22–32)
Chloride: 103 mmol/L (ref 101–111)
Creatinine, Ser: 1.66 mg/dL — ABNORMAL HIGH (ref 0.61–1.24)
GFR calc Af Amer: 41 mL/min — ABNORMAL LOW (ref >60)
GFR, EST NON AFRICAN AMERICAN: 35 mL/min — AB (ref >60)
Glucose, Bld: 102 mg/dL — ABNORMAL HIGH (ref 65–99)
Potassium: 4.5 mmol/L (ref 3.5–5.1)
Sodium: 138 mmol/L (ref 135–145)
TOTAL PROTEIN: 7.7 g/dL (ref 6.5–8.1)

## 2016-11-17 LAB — PROTIME-INR
INR: 1.47
PROTHROMBIN TIME: 18 s — AB (ref 11.4–15.2)

## 2016-11-17 LAB — TROPONIN I
Troponin I: 0.04 ng/mL (ref <0.03)
Troponin I: 0.05 ng/mL (ref <0.03)

## 2016-11-17 LAB — I-STAT CG4 LACTIC ACID, ED
Lactic Acid, Venous: 1.87 mmol/L (ref 0.5–1.9)
Lactic Acid, Venous: 1.32 mmol/L (ref 0.5–1.9)

## 2016-11-17 LAB — I-STAT TROPONIN, ED: Troponin i, poc: 0.01 ng/mL (ref 0.00–0.08)

## 2016-11-17 MED ORDER — POTASSIUM CHLORIDE 20 MEQ/15ML (10%) PO SOLN: 10.0000 meq | Freq: Every day | ORAL | Status: DC

## 2016-11-17 MED ORDER — ONDANSETRON HCL 4 MG/2ML IJ SOLN: 4.0000 mg | Freq: Four times a day (QID) | INTRAMUSCULAR | Status: DC | PRN

## 2016-11-17 MED ORDER — HEPARIN SODIUM (PORCINE) 5000 UNIT/ML IJ SOLN: 5000.0000 [IU] | Freq: Three times a day (TID) | INTRAMUSCULAR | Status: DC

## 2016-11-17 MED ORDER — TAMSULOSIN HCL 0.4 MG PO CAPS: 0.4000 mg | ORAL_CAPSULE | Freq: Every day | ORAL | Status: DC

## 2016-11-17 MED ORDER — LORAZEPAM 2 MG/ML IJ SOLN
1.0000 mg | Freq: Once | INTRAMUSCULAR | Status: AC
  Administered 2016-11-17: 1 mg via INTRAVENOUS
  Filled 2016-11-17: qty 1

## 2016-11-17 MED ORDER — FUROSEMIDE 10 MG/ML IJ SOLN
40.0000 mg | Freq: Every day | INTRAMUSCULAR | Status: DC
  Filled 2016-11-17: qty 4

## 2016-11-17 MED ORDER — FUROSEMIDE 10 MG/ML IJ SOLN
40.0000 mg | Freq: Once | INTRAMUSCULAR | Status: DC
  Filled 2016-11-17: qty 4

## 2016-11-17 MED ORDER — PANTOPRAZOLE SODIUM 40 MG PO TBEC: 40.0000 mg | DELAYED_RELEASE_TABLET | Freq: Every day | ORAL | Status: DC

## 2016-11-17 MED ORDER — ACETAMINOPHEN 325 MG PO TABS: 650.0000 mg | ORAL_TABLET | ORAL | Status: DC | PRN

## 2016-11-17 MED ORDER — ATORVASTATIN CALCIUM 40 MG PO TABS: 40.0000 mg | ORAL_TABLET | Freq: Every day | ORAL | Status: DC

## 2016-11-17 MED ORDER — SODIUM CHLORIDE 0.9 % IV SOLN: 250.0000 mL | INTRAVENOUS | Status: DC | PRN

## 2016-11-17 MED ORDER — SODIUM CHLORIDE 0.9% FLUSH
3.0000 mL | Freq: Two times a day (BID) | INTRAVENOUS | Status: DC
  Administered 2016-11-18: 3 mL via INTRAVENOUS

## 2016-11-17 MED ORDER — CLOPIDOGREL BISULFATE 75 MG PO TABS
37.5000 mg | ORAL_TABLET | Freq: Every day | ORAL | Status: DC
  Filled 2016-11-17: qty 1

## 2016-11-17 MED ORDER — FUROSEMIDE 10 MG/ML IJ SOLN
40.0000 mg | Freq: Once | INTRAMUSCULAR | Status: AC
  Administered 2016-11-17: 40 mg via INTRAVENOUS
  Filled 2016-11-17: qty 4

## 2016-11-17 MED ORDER — HYPROMELLOSE (GONIOSCOPIC) 2.5 % OP SOLN
1.0000 [drp] | Freq: Every day | OPHTHALMIC | Status: DC
  Administered 2016-11-18: 1 [drp] via OPHTHALMIC
  Filled 2016-11-17: qty 15

## 2016-11-17 MED ORDER — SODIUM CHLORIDE 0.9% FLUSH
3.0000 mL | INTRAVENOUS | Status: DC | PRN
Start: 2016-11-17 — End: 2016-11-18

## 2016-11-17 MED ORDER — TRAMADOL HCL 50 MG PO TABS: 50.0000 mg | ORAL_TABLET | Freq: Every day | ORAL | Status: DC

## 2016-11-17 MED ORDER — APIXABAN 2.5 MG PO TABS
2.5000 mg | ORAL_TABLET | Freq: Two times a day (BID) | ORAL | Status: DC
  Administered 2016-11-18: 2.5 mg via ORAL
  Filled 2016-11-17 (×2): qty 1

## 2016-11-17 NOTE — ED Triage Notes (Addendum)
Pt presents with c/o SOB. The SOB began last night in the middle of the night. He reports hallucinations, cp, increased cough. He says he is seeing things that aren't there and feels like hes moving when hes not. He denies fevers, urinary changes. He took nitro x 2 with some decrease in CP. He was also given benadryl by his PCP for itching this week which he took this Thursday and Friday.

## 2016-11-17 NOTE — ED Notes (Signed)
Patient in Xray

## 2016-11-17 NOTE — ED Notes (Signed)
Lab called for result for troponin of 0.04.

## 2016-11-17 NOTE — H&P (Signed)
Referring Physician:  ARCHIBALD Gregg Hodges is an 83 y.o. male.                       Chief Complaint: Shortness of breath  HPI: 83 year old male with PMH of CHF, CAD, CKD with single kidney, hypertension and hyperlipidemia had lasix dose decreased to 40 mg. From 80 mg. Per day to conserve renal function on Thursday. Today he started having shortness of breath and leg edema. Mildly elevated BNP and chest x-ray positive for vascular congestion.  Past Medical History:  Diagnosis Date  . Anginal pain ([place])   . Arthritis    "all over" (04/26/2016)  . Cancer ([place])    Malignant adnoid carcinoma (roof of mouth)  . CHF (congestive heart failure) ([place])   . Chronic back pain   . CKD (chronic kidney disease), stage III   . Complication of anesthesia    only to ether  . Coronary artery disease   . Foot drop, right    "had aneurysm behind my knee; foot corrected itself"  . GERD (gastroesophageal reflux disease)   . Heart murmur   . High cholesterol   . Hypertension   . Insomnia   . Myocardial infarction 11/2014  . Renal disorder    kidney stones, resulted in blockage of L kidney, later removed due to damage  . Shortness of breath dyspnea       Past Surgical History:  Procedure Laterality Date  . ABDOMINAL AORTIC ANEURYSM REPAIR    . CHOLECYSTECTOMY    . CORONARY ARTERY BYPASS GRAFT  1983   "CABG X5"  . ERCP N/A 02/25/2013   Procedure: ENDOSCOPIC RETROGRADE CHOLANGIOPANCREATOGRAPHY (ERCP);  Surgeon: Jeryl Columbia, MD;  Location: [facility];  Service: Endoscopy;  Laterality: N/A;  . ESOPHAGOGASTRODUODENOSCOPY    . EXCISIONAL HEMORRHOIDECTOMY    . INGUINAL HERNIA REPAIR Right   . JOINT REPLACEMENT    . LEFT HEART CATHETERIZATION WITH CORONARY/GRAFT ANGIOGRAM N/A 12/02/2014   Procedure: LEFT HEART CATHETERIZATION WITH Beatrix Fetters;  Surgeon: Clent Demark, MD;  Location: [facility] CATH LAB;  Service: Cardiovascular;  Laterality: N/A;  . SKIN CANCER EXCISION  ~ 2000   "roof of my mouth; ~ size of  quarter; benign; [place]"  . TONSILLECTOMY    . TOTAL KNEE ARTHROPLASTY Bilateral     History reviewed. No pertinent family history. Social History:  reports that he quit smoking about 49 years ago. His smoking use included Cigarettes. He has a 12.00 pack-year smoking history. He has never used smokeless tobacco. He reports that he does not drink alcohol or use drugs.  Allergies:  Allergies  Allergen Reactions  . Isosorbide Mononitrate Other (See Comments)    REACTION: Blurred vision,and lightheadedness.  . Aspirin Other (See Comments)    Patient is already on blood thinner  . Ether Other (See Comments)    Reaction not known     (Not in a hospital admission)  Results for orders placed or performed during the hospital encounter of 11/14/2016 (from the past 48 hour(s))  Comprehensive metabolic panel     Status: Abnormal   Collection Time: 11/06/2016  3:14 PM  Result Value Ref Range   Sodium 138 135 - 145 mmol/L   Potassium 4.5 3.5 - 5.1 mmol/L   Chloride 103 101 - 111 mmol/L   CO2 25 22 - 32 mmol/L   Glucose, Bld 102 (H) 65 - 99 mg/dL   BUN 33 (H) 6 - 20 mg/dL  Creatinine, Ser 1.66 (H) 0.61 - 1.24 mg/dL   Calcium 8.7 (L) 8.9 - 10.3 mg/dL   Total Protein 7.7 6.5 - 8.1 g/dL   Albumin 3.3 (L) 3.5 - 5.0 g/dL   AST 22 15 - 41 U/L   ALT 15 (L) 17 - 63 U/L   Alkaline Phosphatase 93 38 - 126 U/L   Total Bilirubin 0.7 0.3 - 1.2 mg/dL   GFR calc non Af Amer 35 (L) >60 mL/min   GFR calc Af Amer 41 (L) >60 mL/min    Comment: (NOTE) The eGFR has been calculated using the CKD EPI equation. This calculation has not been validated in all clinical situations. eGFR's persistently <60 mL/min signify possible Chronic Kidney Disease.    Anion gap 10 5 - 15  CBC with Differential     Status: Abnormal   Collection Time: 11/20/2016  3:14 PM  Result Value Ref Range   WBC 8.2 4.0 - 10.5 K/uL   RBC 4.37 4.22 - 5.81 MIL/uL   Hemoglobin 11.4 (L) 13.0 - 17.0 g/dL   HCT 38.1 (L) 39.0 - 52.0 %    MCV 87.2 78.0 - 100.0 fL   MCH 26.1 26.0 - 34.0 pg   MCHC 29.9 (L) 30.0 - 36.0 g/dL   RDW 17.1 (H) 11.5 - 15.5 %   Platelets 219 150 - 400 K/uL   Neutrophils Relative % 68 %   Neutro Abs 5.6 1.7 - 7.7 K/uL   Lymphocytes Relative 13 %   Lymphs Abs 1.0 0.7 - 4.0 K/uL   Monocytes Relative 8 %   Monocytes Absolute 0.6 0.1 - 1.0 K/uL   Eosinophils Relative 10 %   Eosinophils Absolute 0.8 (H) 0.0 - 0.7 K/uL   Basophils Relative 1 %   Basophils Absolute 0.1 0.0 - 0.1 K/uL  Protime-INR     Status: Abnormal   Collection Time: 10/30/2016  3:14 PM  Result Value Ref Range   Prothrombin Time 18.0 (H) 11.4 - 15.2 seconds   INR 1.47   Brain natriuretic peptide     Status: Abnormal   Collection Time: 11/08/2016  3:14 PM  Result Value Ref Range   B Natriuretic Peptide 451.2 (H) 0.0 - 100.0 pg/mL  I-Stat Troponin, ED (not at [facility])     Status: None   Collection Time: 10/28/2016  3:47 PM  Result Value Ref Range   Troponin i, poc 0.01 0.00 - 0.08 ng/mL   Comment 3            Comment: Due to the release kinetics of cTnI, a negative result within the first hours of the onset of symptoms does not rule out myocardial infarction with certainty. If myocardial infarction is still suspected, repeat the test at appropriate intervals.   I-Stat CG4 Lactic Acid, ED     Status: None   Collection Time: 11/07/2016  3:49 PM  Result Value Ref Range   Lactic Acid, Venous 1.87 0.5 - 1.9 mmol/L  Urinalysis, Routine w reflex microscopic     Status: Abnormal   Collection Time: 11/01/2016  4:09 PM  Result Value Ref Range   Color, Urine YELLOW YELLOW   APPearance CLEAR CLEAR   Specific Gravity, Urine 1.011 1.005 - 1.030   pH 7.0 5.0 - 8.0   Glucose, UA NEGATIVE NEGATIVE mg/dL   Hgb urine dipstick SMALL (A) NEGATIVE   Bilirubin Urine NEGATIVE NEGATIVE   Ketones, ur NEGATIVE NEGATIVE mg/dL   Protein, ur NEGATIVE NEGATIVE mg/dL   Nitrite NEGATIVE NEGATIVE  Leukocytes, UA NEGATIVE NEGATIVE   RBC / HPF 6-30 0 - 5 RBC/hpf    WBC, UA 0-5 0 - 5 WBC/hpf   Bacteria, UA NONE SEEN NONE SEEN   Squamous Epithelial / LPF NONE SEEN NONE SEEN   Hyaline Casts, UA PRESENT   I-Stat arterial blood gas, ED     Status: Abnormal   Collection Time: 10/29/2016  6:49 PM  Result Value Ref Range   pH, Arterial 7.422 7.350 - 7.450   pCO2 arterial 42.5 32.0 - 48.0 mmHg   pO2, Arterial 76.0 (L) 83.0 - 108.0 mmHg   Bicarbonate 27.7 20.0 - 28.0 mmol/L   TCO2 29 0 - 100 mmol/L   O2 Saturation 95.0 %   Acid-Base Excess 3.0 (H) 0.0 - 2.0 mmol/L   Patient temperature 98.6 F    Collection site RADIAL, ALLEN'S TEST ACCEPTABLE    Drawn by Operator    Sample type ARTERIAL    Dg Chest 2 View  Result Date: 11/01/2016 CLINICAL DATA:  83 year old male with acute shortness of breath. EXAM: CHEST  2 VIEW COMPARISON:  08/16/2016 and prior radiographs dating back to 02/05/2010 FINDINGS: Cardiomegaly and CABG changes again noted. Mild pulmonary vascular congestion noted. There is no evidence of focal airspace disease, pulmonary edema, suspicious pulmonary nodule/mass, pleural effusion, or pneumothorax. No acute bony abnormalities are identified. IMPRESSION: Cardiomegaly with mild pulmonary vascular congestion. Electronically Signed   By: Margarette Canada M.D.   On: 11/04/2016 16:28   Ct Head Wo Contrast  Result Date: 11/02/2016 CLINICAL DATA:  Sudden onset of confusion EXAM: CT HEAD WITHOUT CONTRAST TECHNIQUE: Contiguous axial images were obtained from the base of the skull through the vertex without intravenous contrast. COMPARISON:  None. FINDINGS: Brain: Mild atrophic and chronic white matter ischemic changes are noted. No findings to suggest acute hemorrhage, acute infarction or space-occupying mass lesion are noted. Vascular: No hyperdense vessel or unexpected calcification. Skull: Normal. Negative for fracture or focal lesion. Sinuses/Orbits: No acute finding. Other: None. IMPRESSION: Chronic atrophic and ischemic changes without acute abnormality.  Electronically Signed   By: Inez Catalina M.D.   On: 10/25/2016 20:05    Review Of Systems Constitutional: No fever, chills, positive weight gain. Eyes: Positive vision change, Wears glasses. No discharge or pain.. Ears: Positive hearing loss, No tinnitus. Respiratory: No asthma, COPD, pneumonias. Positive shortness of breath. No hemoptysis. Cardiovascular: No chest pain, palpitation or leg edema. Gastrointestinal: No nausea, vomiting or diarrhea or constipation. No GI bleed. No hepatitis. Genitourinary: No dysuria, hematuria or kidney stone. No incontinance. Has right kidney functioning.  Neurological: No headache, stroke or seizures.  Psychiatry: No psych facility admission for anxiety, depression or suicide. No detox. Skin: No rash. Musculoskeletal: Positive joint pain. No fibromyalgia. No neck pain. Positive back pain. Lymphadenopathy: No lymphadenopathy Hematology: No anemia or easy bruising.   Blood pressure 139/82, pulse (!) 51, temperature 98.3 F (36.8 C), temperature source Rectal, resp. rate (!) 29, height _0  (1.854 m), weight 80 kg (176 lb 4.8 oz), SpO2 97 %. Body mass index is 23.26 kg/m. General appearance: alert, cooperative, appears stated age and moderate respiratory distress Head: Normocephalic, atraumatic. Eyes: pink conjunctivae/corneas clear. PERRL, EOM's intact. Neck: no adenopathy, no carotid bruit, + JVD, supple, symmetrical, trachea midline and thyroid not enlarged. Resp: clear to auscultation bilaterally Cardio: regular rate and rhythm, S1, S2 normal, II/VI systolic murmur, no click, rub or gallop GI: soft, non-tender; bowel sounds normal; no masses,  no organomegaly Extremities: No cyanosis. 2 + edema  Skin: Warm and dry. No rashes or lesions Neurologic: Alert and oriented X 3, normal strength and tone.   Assessment/Plan Acute left systolic heart failure Hypertension CAD Chronic Atrial fibrillation with CHA2DS2VASc score 5 CKD,  III Hyperlipidemia  Admit/IV lasix/R/O MI.  Birdie Riddle, MD  10/30/2016, 9:39 PM

## 2016-11-17 NOTE — ED Notes (Signed)
Called Lab added BNP

## 2016-11-17 NOTE — ED Notes (Signed)
Patient transported to CT 

## 2016-11-17 NOTE — ED Provider Notes (Addendum)
[name] Provider Note   CSN: [id number] Arrival date & time: 11/16/2016  1453     History   Chief Complaint Chief Complaint  Patient presents with  . Shortness of Breath    HPI Gregg Hodges is a 83 y.o. male. CC:  Difficulty breathing  HPI:  83 year old male with history of CHF. Followed primarily by Dr. Terrence Dupont, and at the [place].  Patient was seen on Wednesday at the [place] for routine follow-up. His daughter-in-law accompanies him there, and here today. She states that he was given a "checkup". She states he was told that his heart and lungs and swelling and congestive heart failure were better than they have been in a long time".  He has some skin itching and was given Benadryl. This Thursday and Friday. He has not had any since yesterday. He was "hallucinating during my sleep".  He has been breathing rapidly since early this morning. His breathing is "noisy". His daughter-in-law, said it "seems to be coming from his mouth". He does state that he feels anxious At times. However he cannot specify if he feels anxious or panicked today.Gregg Hodges  He has not had swelling in his legs. He does not have a cough or sputum production. He took a nitroglycerin to see if it would help which did not.  He has not taken the Benadryl since yesterday. His Lasix was cut from 80, to 40 on Wednesday with his "good checkup".  Past Medical History:  Diagnosis Date  . Anginal pain ([place])   . Arthritis    "all over" (04/26/2016)  . Cancer ([place])    Malignant adnoid carcinoma (roof of mouth)  . CHF (congestive heart failure) ([place])   . Chronic back pain   . CKD (chronic kidney disease), stage III   . Complication of anesthesia    only to ether  . Coronary artery disease   . Foot drop, right    "had aneurysm behind my knee; foot corrected itself"  . GERD (gastroesophageal reflux disease)   . Heart murmur   . High cholesterol   . Hypertension   . Insomnia   . Myocardial infarction 11/2014  . Renal  disorder    kidney stones, resulted in blockage of L kidney, later removed due to damage  . Shortness of breath dyspnea     Patient Active Problem List   Diagnosis Date Noted  . Acute left systolic heart failure ([place]) 11/02/2016  . CHF (congestive heart failure) ([place]) 04/26/2016  . Unstable angina ([place]) 03/06/2016  . Acute coronary syndrome ([place]) 12/02/2014  . Choledocholithiasis 02/25/2013  . Pancreatitis, acute 02/23/2013  . Acute on chronic renal failure ([place]) 02/23/2013  . Transaminitis 02/23/2013  . CAD, ARTERY BYPASS GRAFT 01/05/2010  . CLAUDICATION 01/05/2010  . CHEST PAIN 01/05/2010  . CORONARY ARTERY DISEASE 12/22/2009  . ATRIAL FIBRILLATION 12/22/2009  . Chronic diastolic heart failure ([place]) 12/22/2009    Past Surgical History:  Procedure Laterality Date  . ABDOMINAL AORTIC ANEURYSM REPAIR    . CHOLECYSTECTOMY    . CORONARY ARTERY BYPASS GRAFT  1983   "CABG X5"  . ERCP N/A 02/25/2013   Procedure: ENDOSCOPIC RETROGRADE CHOLANGIOPANCREATOGRAPHY (ERCP);  Surgeon: Jeryl Columbia, MD;  Location: [facility] ORS;  Service: Endoscopy;  Laterality: N/A;  . ESOPHAGOGASTRODUODENOSCOPY    . EXCISIONAL HEMORRHOIDECTOMY    . INGUINAL HERNIA REPAIR Right   . JOINT REPLACEMENT    . LEFT HEART CATHETERIZATION WITH CORONARY/GRAFT ANGIOGRAM N/A 12/02/2014   Procedure: LEFT HEART CATHETERIZATION  WITH Beatrix Fetters;  Surgeon: Clent Demark, MD;  Location[facility] CATH LAB;  Service: Cardiovascular;  Laterality: N/A;  . SKIN CANCER EXCISION  ~ 2000   "roof of my mouth; ~ size of quarter; benign; [place]"  . TONSILLECTOMY    . TOTAL KNEE ARTHROPLASTY Bilateral        Home Medications    Prior to Admission medications   Not on File    Family History History reviewed. No pertinent family history.  Social History Social History  Substance Use Topics  . Smoking status: Former Smoker    Packs/day: 0.50    Years: 24.00    Types: Cigarettes    Quit date: 02/24/1967  . Smokeless  tobacco: Never Used  . Alcohol use No     Allergies   Isosorbide mononitrate; Aspirin; and Ether   Review of Systems Review of Systems  Constitutional: Negative for appetite change, chills, diaphoresis, fatigue and fever.  HENT: Negative for mouth sores, sore throat and trouble swallowing.   Eyes: Negative for visual disturbance.  Respiratory: Positive for shortness of breath. Negative for cough, chest tightness and wheezing.   Cardiovascular: Negative for chest pain.  Gastrointestinal: Negative for abdominal distention, abdominal pain, diarrhea, nausea and vomiting.  Endocrine: Negative for polydipsia, polyphagia and polyuria.  Genitourinary: Negative for dysuria, frequency and hematuria.  Musculoskeletal: Negative for gait problem.  Skin: Negative for color change, pallor and rash.  Neurological: Negative for dizziness, syncope, light-headedness and headaches.       His "hallucinations" are feeling that sometimes things are moving when he sitting still. He states he thought the wheelchair was moving when he was sitting in it. He has not had visual, or auditory hallucinations.  Hematological: Does not bruise/bleed easily.  Psychiatric/Behavioral: Negative for behavioral problems and confusion.     Physical Exam Updated Vital Signs BP 104/67 (BP Location: Left Arm)   Pulse 94   Temp 97.7 F (36.5 C) (Axillary)   Resp (!) 22   Ht 6\' 1"  (1.854 m)   Wt 179 lb 7.3 oz (81.4 kg)   SpO2 97%   BMI 23.68 kg/m   Physical Exam  Constitutional: He is oriented to person, place, and time. He appears well-developed and well-nourished. No distress.  HENT:  Head: Normocephalic.  Eyes: Conjunctivae are normal. Pupils are equal, round, and reactive to light. No scleral icterus.  Neck: Normal range of motion. Neck supple. No thyromegaly present.  Cardiovascular: Normal rate and regular rhythm.  Exam reveals no gallop and no friction rub.   No murmur heard. Pulmonary/Chest: Effort normal  and breath sounds normal. No respiratory distress. He has no wheezes. He has no rales.  Patient has rapid respirations with some transmitted upper airway sounds. . No frank stridor. Tachypnea  Abdominal: Soft. Bowel sounds are normal. He exhibits no distension. There is no tenderness. There is no rebound.  Musculoskeletal: Normal range of motion.  Neurological: He is alert and oriented to person, place, and time.  Condition removal 4 extremities without deficits. Cranial nerves show no facial droop. Normal extraocular movements. Normal facial sensation. No obvious deficits. Per family he has been confused and "talking about barbecue, [place], and was "Just singing".  Skin: Skin is warm and dry. No rash noted.  No lower extremity edema.  Psychiatric: He has a normal mood and affect. His behavior is normal.     ED Treatments / Results  Labs (all labs ordered are listed, but only abnormal results are displayed) Labs Reviewed  COMPREHENSIVE METABOLIC PANEL - Abnormal; Notable for the following:       Result Value   Glucose, Bld 102 (*)    BUN 33 (*)    Creatinine, Ser 1.66 (*)    Calcium 8.7 (*)    Albumin 3.3 (*)    ALT 15 (*)    GFR calc non Af Amer 35 (*)    GFR calc Af Amer 41 (*)    All other components within normal limits  CBC WITH DIFFERENTIAL/PLATELET - Abnormal; Notable for the following:    Hemoglobin 11.4 (*)    HCT 38.1 (*)    MCHC 29.9 (*)    RDW 17.1 (*)    Eosinophils Absolute 0.8 (*)    All other components within normal limits  PROTIME-INR - Abnormal; Notable for the following:    Prothrombin Time 18.0 (*)    All other components within normal limits  URINALYSIS, ROUTINE W REFLEX MICROSCOPIC - Abnormal; Notable for the following:    Hgb urine dipstick SMALL (*)    All other components within normal limits  BRAIN NATRIURETIC PEPTIDE - Abnormal; Notable for the following:    B Natriuretic Peptide 451.2 (*)    All other components within normal limits  TROPONIN I -  Abnormal; Notable for the following:    Troponin I 0.04 (*)    All other components within normal limits  CBC - Abnormal; Notable for the following:    Hemoglobin 11.1 (*)    HCT 36.7 (*)    RDW 16.6 (*)    All other components within normal limits  CREATININE, SERUM - Abnormal; Notable for the following:    Creatinine, Ser 1.69 (*)    GFR calc non Af Amer 34 (*)    GFR calc Af Amer 40 (*)    All other components within normal limits  TROPONIN I - Abnormal; Notable for the following:    Troponin I 0.05 (*)    All other components within normal limits  TROPONIN I - Abnormal; Notable for the following:    Troponin I 0.08 (*)    All other components within normal limits  BASIC METABOLIC PANEL - Abnormal; Notable for the following:    Glucose, Bld 120 (*)    BUN 35 (*)    Creatinine, Ser 1.74 (*)    GFR calc non Af Amer 33 (*)    GFR calc Af Amer 38 (*)    All other components within normal limits  I-STAT ARTERIAL BLOOD GAS, ED - Abnormal; Notable for the following:    pO2, Arterial 76.0 (*)    Acid-Base Excess 3.0 (*)    All other components within normal limits  CULTURE, BLOOD (ROUTINE X 2)  URINE CULTURE  MRSA PCR SCREENING  I-STAT CG4 LACTIC ACID, ED  I-STAT TROPOININ, ED  I-STAT CG4 LACTIC ACID, ED  I-STAT CG4 LACTIC ACID, ED    EKG  EKG Interpretation  Date/Time:  Saturday November 17 2016 22:18:54 EST Ventricular Rate:  97 PR Interval:    QRS Duration: 154 QT Interval:  421 QTC Calculation: 535 R Axis:   -88 Text Interpretation:  Age not entered, assumed to be  83 years old for purpose of ECG interpretation Sinus rhythm Sinus pause RBBB and LAFB LVH with secondary repolarization abnormality Confirmed by Lita Mains  MD, DAVID ([id number]) on 2016/11/19 7:28:51 PM       Radiology No results found.  Procedures Procedures (including critical care time)  Medications Ordered in ED Medications  LORazepam (ATIVAN) injection 1 mg (1 mg Intravenous Given 11/15/2016 1704)    LORazepam (ATIVAN) injection 1 mg (1 mg Intravenous Given 10/26/2016 2154)  furosemide (LASIX) injection 40 mg (40 mg Intravenous Given 11/03/2016 2208)     Initial Impression / Assessment and Plan / ED Course  I have reviewed the triage vital signs and the nursing notes.  Pertinent labs & imaging results that were available during my care of the patient were reviewed by me and considered in my medical decision making (see chart for details).     Minimal Pulmonary congestion on chest x-ray. Clinically not in congestive heart failure. EKG shows sinus bradycardia. Remainder of labs pending. Patient does not appear anxious. But is tachypneic. He'll be given some Ativan. Await labs. We'll reevaluate.   18:39:  Patient remains tachypneic after Ativan. Is not acidotic. Normal lactate. No obvious explaination for tachypnea. We'll obtain ABG. Head CT.  Final Clinical Impressions(s) / ED Diagnoses   Final diagnoses:  Congestive heart failure, unspecified congestive heart failure chronicity, unspecified congestive heart failure type [facility])    New Prescriptions Discharge Medication List as of 04-Dec-2016 12:21 PM       Tanna Furry, MD 11/22/16 1438    Tanna Furry, MD 12/02/16 2328

## 2016-11-18 ENCOUNTER — Other Ambulatory Visit: Payer: Self-pay

## 2016-11-18 ENCOUNTER — Inpatient Hospital Stay (HOSPITAL_COMMUNITY): Payer: Medicare Other

## 2016-11-18 LAB — URINE CULTURE: CULTURE: NO GROWTH

## 2016-11-18 LAB — BASIC METABOLIC PANEL
Anion gap: 12 (ref 5–15)
BUN: 35 mg/dL — AB (ref 6–20)
CO2: 24 mmol/L (ref 22–32)
CREATININE: 1.74 mg/dL — AB (ref 0.61–1.24)
Calcium: 9 mg/dL (ref 8.9–10.3)
Chloride: 103 mmol/L (ref 101–111)
GFR calc Af Amer: 38 mL/min — ABNORMAL LOW (ref >60)
GFR, EST NON AFRICAN AMERICAN: 33 mL/min — AB (ref >60)
GLUCOSE: 120 mg/dL — AB (ref 65–99)
POTASSIUM: 4.8 mmol/L (ref 3.5–5.1)
SODIUM: 139 mmol/L (ref 135–145)

## 2016-11-18 LAB — TROPONIN I: Troponin I: 0.08 ng/mL (ref <0.03)

## 2016-11-18 LAB — MRSA PCR SCREENING: MRSA BY PCR: NEGATIVE

## 2016-11-18 MED ORDER — DIPHENHYDRAMINE HCL 25 MG PO CAPS: 25.0000 mg | ORAL_CAPSULE | Freq: Every evening | ORAL | Status: DC | PRN

## 2016-11-22 LAB — CULTURE, BLOOD (ROUTINE X 2): CULTURE: NO GROWTH

## 2016-11-22 NOTE — Progress Notes (Signed)
Call received from [place] at [phone] stating patient called out reporting difficulty breathing. This nurse left adjacent room, entered pt room to receive a call from [place] reporting heart rate on Gregg Hodges at 29 bpm.   Secretary placed call to rapid response team simultaneously.   Upon lights on in room, pt discovered with agonal breathing and no pulse detected.  Code blue called, bed dropped, CPR/Chest Compressions initiated by this nurse. Crash cart brought to room by floor staff.  Granddaughter, later discovered as 47, declared pt wished to be DNR, and not have chest compressions. Mid-CPR family informed of computer record reflecting FULL code status, therefore CPR must continue.  Per Dr Doylene Canard (en route to room) pt was presumed to be DNR by MD as well.   Once HCPOA status was revealed at bedside, pt/family wishes were respected and CPR was discontinued. Code blue canceled. Dr Doylene Canard called TOD at [phone].

## 2016-11-22 NOTE — Discharge Summary (Signed)
Physician Discharge Summary  Patient ID: Gregg Hodges MRN: [id number] DOB/AGE: Oct 12, 1926 83 y.o.  Admit date: 10/22/2016 Discharge date: 2016/11/21  Admission Diagnoses: Acute left systolic heart failure Hypertension CAD Chronic atrial fibrillation with CHA2DS2VASc score 5 CKD, III Hyperlipidemia  Discharge Diagnoses:  Principle diagnosis: * Acute non-Q wave Myocardial infarction * Active Problems:   Acute left systolic heart failure (HCC)   Multivessel CAD   Chronic atrial fibrillation with CHA2DS2VASC score 5   CKD, III with solitary kidney   Hypertension   Hyperlipidemia   Microscopic hematuria  Discharged Condition: poor  Hospital Course: 83 year old male with PMH of CHF, CAD, CKD with solitary kidney, hypertension and hyperlipidemia presented with shortness of breath and leg edema. His lasix dose was decreased 48 hours ago to conserve renal function in a solitary kidney. Acute on chronic left heart failure treatment was started. Patient had cardio-pulmonary arrest this AM and family made him DNR. He was pronounced dead at 8:55 AM today. Appropriate disposition to funeral home will be made by nursing staff assisting family. Primary care Dr. Terrence Dupont was notified and death certificate was issued.   Consults: cardiology  Significant Diagnostic Studies: labs: Mildly elevated troponin-I of 0.08 and BNP of 451.2. Normal electrolytes with BUN 33 and creatinine 1.66. CBC near normal except Hgb of 11.4.  EKG Significant ST depression in lateral leads with ST elevation in aVR and sinus tachycardia suggestive of acute non-ST elevation MI.  Chest x-ray: Cardiomegaly with mild pulmonary vascular congestion.  Treatments: Plavix, apixaban, Imdur, Protonix, potassium and IV lasix  Discharge Exam: Blood pressure 104/67, pulse 94, temperature 97.7 F (36.5 C), temperature source Axillary, resp. rate (!) 22, height 6\' 1"  (1.854 m), weight (P) 81.4 kg (179 lb 7.3 oz), SpO2 97 %. General  appearance: Appears stated age Head: Normocephalic, atraumatic. Eyes: pink conjunctivae/corneas clear.   Neck: no adenopathy, no carotid bruit. Resp: No breath sounds. Cardio: No heart sounds. GI: soft. Extremities: 1 + edema Skin: Cool and dry.  Neurologic: Non-responsive.  Disposition: 20-Expired.   Allergies as of 11-21-16      Reactions   Isosorbide Mononitrate Other (See Comments)   REACTION: Blurred vision,and lightheadedness.   Aspirin Other (See Comments)   Patient is already on blood thinner   Ether Other (See Comments)   Reaction not known      Medication List    STOP taking these medications   acetaminophen 325 MG tablet Commonly known as:  TYLENOL   apixaban 2.5 MG Tabs tablet Commonly known as:  ELIQUIS   atorvastatin 40 MG tablet Commonly known as:  LIPITOR   clopidogrel 75 MG tablet Commonly known as:  PLAVIX   furosemide 40 MG tablet Commonly known as:  LASIX   hydroxypropyl methylcellulose / hypromellose 2.5 % ophthalmic solution Commonly known as:  ISOPTO TEARS / GONIOVISC   isosorbide dinitrate 20 MG tablet Commonly known as:  ISORDIL   nitroGLYCERIN 0.4 MG SL tablet Commonly known as:  NITROSTAT   omeprazole 20 MG capsule Commonly known as:  PRILOSEC   tamsulosin 0.4 MG Caps capsule Commonly known as:  FLOMAX   traMADol 50 MG tablet Commonly known as:  ULTRAM        SignedDixie Dials S 2016/11/21, 9:49 AM

## 2016-11-22 NOTE — Progress Notes (Signed)
Patient arrived to the unit with granddaughter, breathing very loudly/noisy. Oxygen saturations are WNL. Grandaughter helps with patient history.

## 2016-11-22 NOTE — Progress Notes (Signed)
Patient's family is at bedside with chaplain.  Death certificate completed by Dr Doylene Canard.  [place] donors form completed, call placed. Pt is not suitable for any donations.

## 2016-11-22 NOTE — Progress Notes (Signed)
Granddaughter agrees to spend the night in order to help watch [name], for safety reasons.

## 2016-11-22 NOTE — Progress Notes (Signed)
Chaplain provided emotional and grief support to family following the passing of the patient.  Granddaughter was present at time of death.  Three more family members joined her in the next hour.  Family accepted offer of prayer.  Family indicates that the funeral home will be [facility] on [address].  This information along with patient address was communicated to the secretary.    Chaplain appreciated RN's warmth and kindness to this family in crisis during patient care and following death.   Luana Shu [phone]     2016/12/02 1015  Clinical Encounter Type  Visited With Family  Visit Type Death  Referral From Nurse  Consult/Referral To Chaplain  Spiritual Encounters  Spiritual Needs Emotional;Grief support;Prayer

## 2016-11-22 NOTE — Significant Event (Signed)
Rapid Response Event Note  Overview: Time Called: 0340 Arrival Time: 0342 Event Type: Respiratory  Initial Focused Assessment: Upper airway noise   Interventions:  Plan of Care (if not transferred):  Event Summary: Called to evaluate patient for upper airway noise. Patient granddaughter is at bedside and reported this started around 3pm yesterday. Patient chart was reviewed. No acute signs of distress. Patient was upper airway stridor that changes with positioning of head. He is able to speak clear but garbled at times. Airway appears patent.  O2 sats are 99% on 2 liters. Positive JVD noted. Lungs with course breath sounds. Heart sound regular. Oral care was provided. Humidified O2 was recommended to help keep his airway moist. We will continue to assist with care as needed. Reported that on-call provider was made aware of patients condition.     Gregg Hodges [place]

## 2016-11-22 NOTE — Progress Notes (Signed)
MD notified about patient condition-loud breathing, combativeness, agitation, confusion, grasping air for unseen objects. Will follow orders and continue to monitor.

## 2016-11-22 NOTE — Progress Notes (Signed)
Ordered patient safety mittens because he keeps on pulling on nasal cannula, gown, and picking at all cords/lines. Will continue to monitor. Put humidification on oxygen, doing oral care to keep mouth moist.

## 2016-11-22 NOTE — Progress Notes (Signed)
Pts family has left the bedside, informing staff they are done in the room; and there will be no more visitors.

## 2016-11-22 NOTE — Progress Notes (Signed)
Message relayed to Dr Doylene Canard that patient Is reportedly, according to granddaughter, disoriented different from his baseline.   Pt repositioned in bed, with Juanita,NT; granddaughter still at bedside.Pt does not appear to be in distress, though still has an audible wheeze/stridor.   Per call with Dr Doylene Canard, he is en route to see patient.

## 2016-11-22 NOTE — Progress Notes (Signed)
Called by Network engineer for assistance in room [phone] with resp distress- stat call to resp also - enroute to room code blue called to same room - on arrival CPR had been initiated - granddaughter in room who is also HCPOA states patient wished to be a DNR - per staff on phone with Dr. Doylene Canard confirmed DNR - code called per MD. No RRT interventions.  Staff supported. Dr. Doylene Canard at bedside.

## 2016-11-22 NOTE — Progress Notes (Signed)
Notified MD about troponin levels 0.04 to 0.08. MD will come and look at the patient.

## 2016-11-22 NOTE — Progress Notes (Signed)
Respiratory called to assess patient, will follow recommendations and continue to monitor.

## 2016-11-22 DEATH — deceased

## 2017-01-09 ENCOUNTER — Ambulatory Visit: Payer: Medicare Other | Admitting: Podiatry

## 2018-08-09 IMAGING — CT CT HEAD W/O CM
3 of 4 series · 16 of 47 positions shown, 19 images · non-contrast
Comparison: None.

CLINICAL DATA: Sudden onset of confusion

EXAM:
CT HEAD WITHOUT CONTRAST
TECHNIQUE: Contiguous axial images were obtained from the base of the skull
through the vertex without intravenous contrast.

[Series 201: head w/o, idose (1) · axial · non-contrast · 0.49mm/px · z∈[+65,+215]mm · 10 of 36 slices shown, 13 images]
[im 3/36  brain]
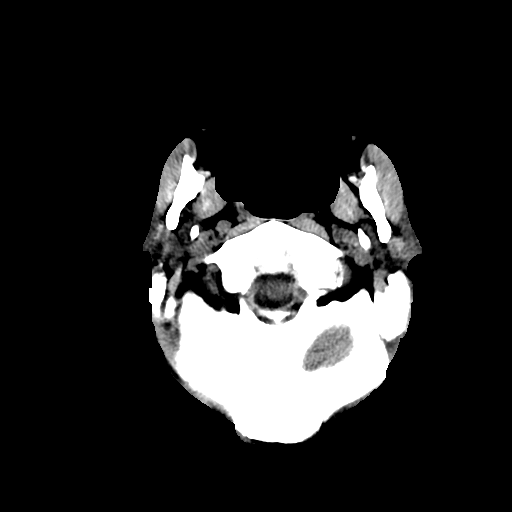
[im 3/36  bone]
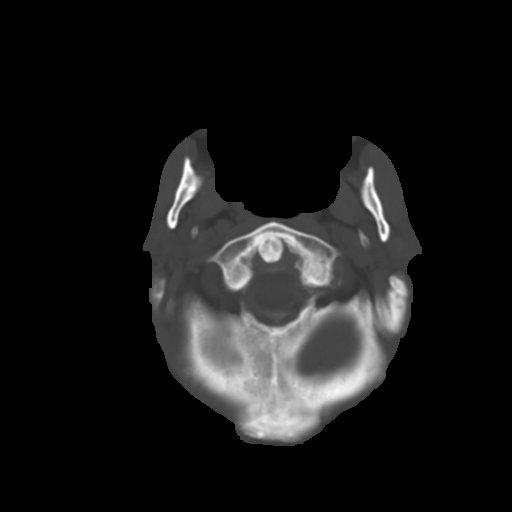
[im 6/36  brain]
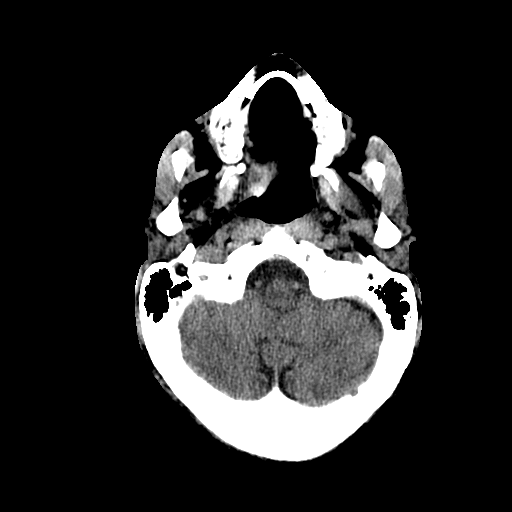
[im 11/36  brain]
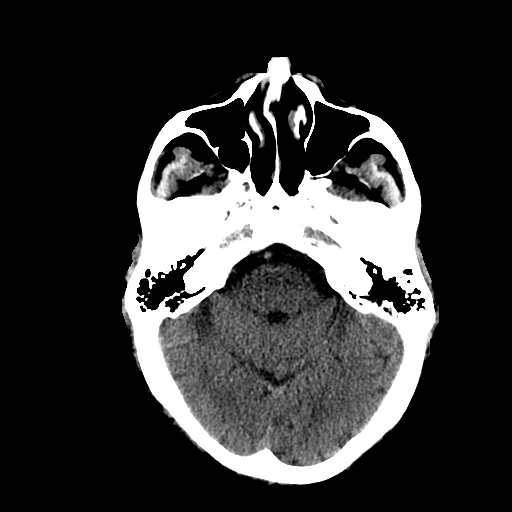
[im 13/36  brain]
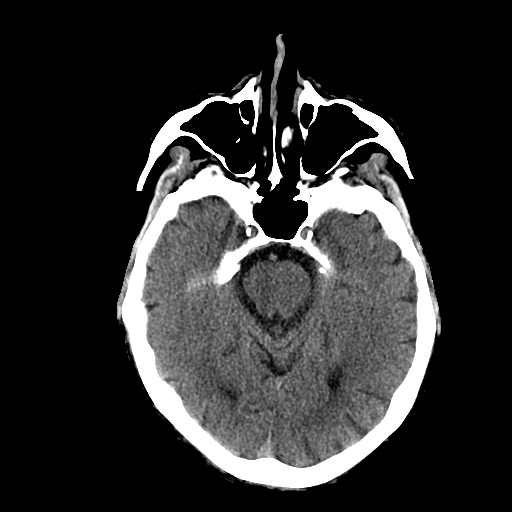
[im 16/36  brain]
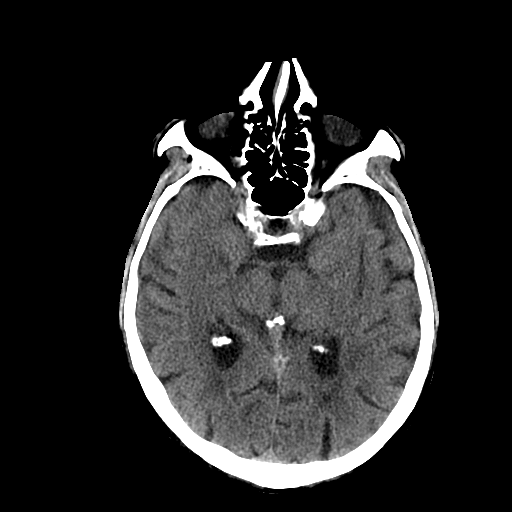
[im 16/36  bone]
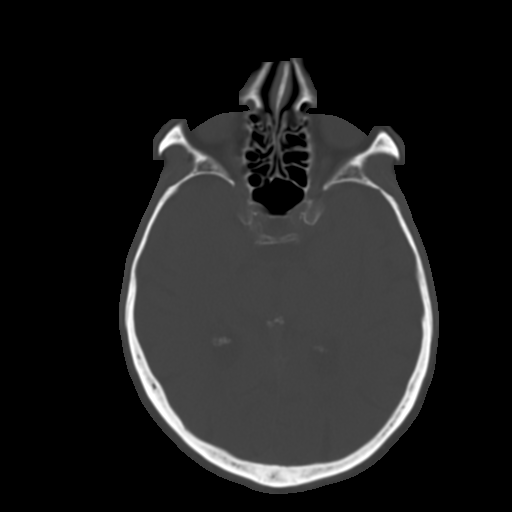
[im 21/36  brain]
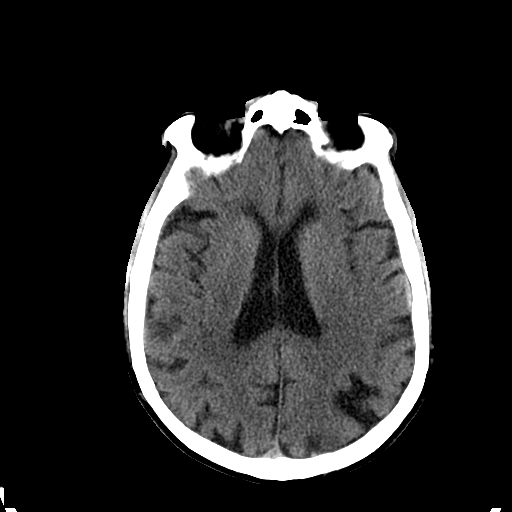
[im 23/36  brain]
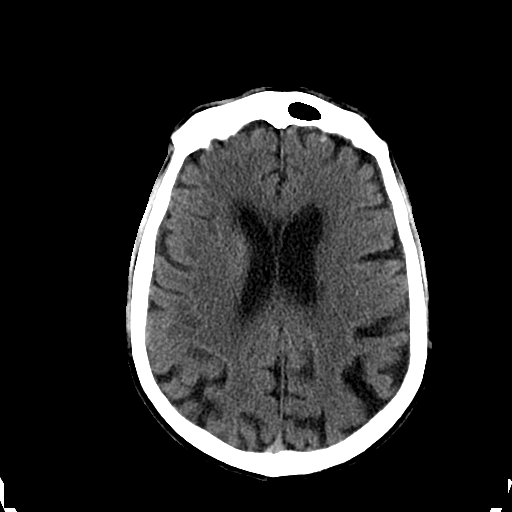
[im 26/36  brain]
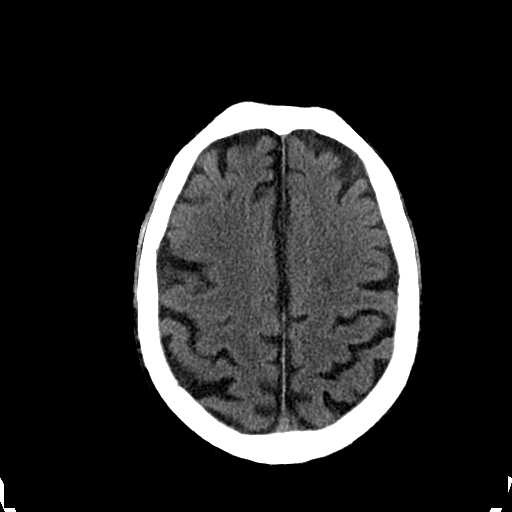
[im 31/36  brain]
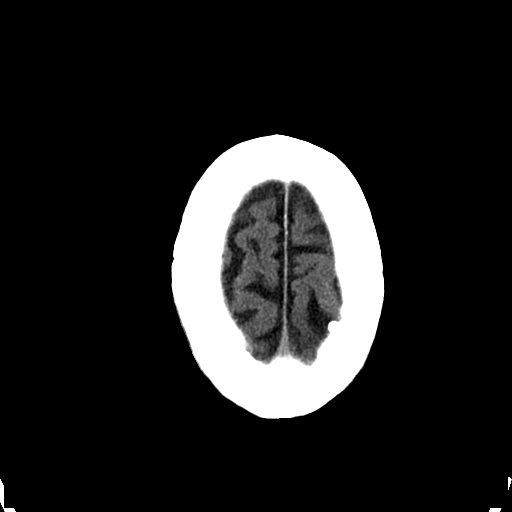
[im 31/36  bone]
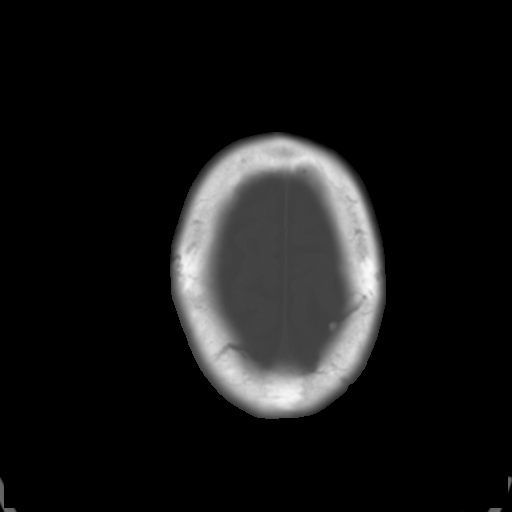
[im 33/36  brain]
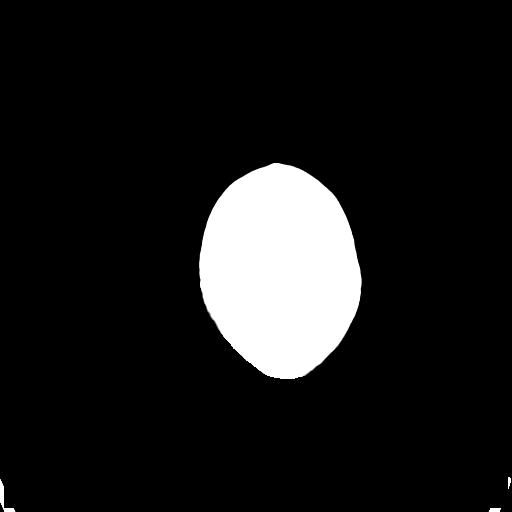

[Series 203: coronal st, idose (1) · coronal · 0.40mm/px · 3 of 83 slices shown]
[im 28/83  brain]
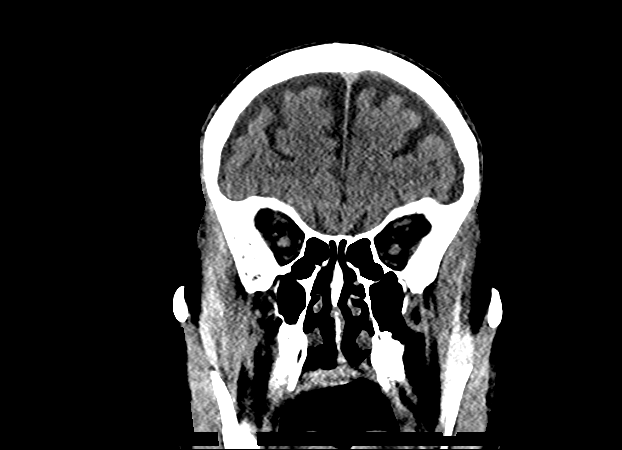
[im 37/83  brain]
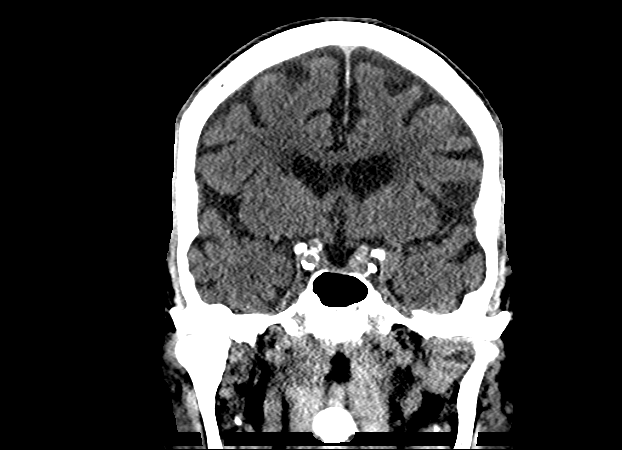
[im 46/83  brain]
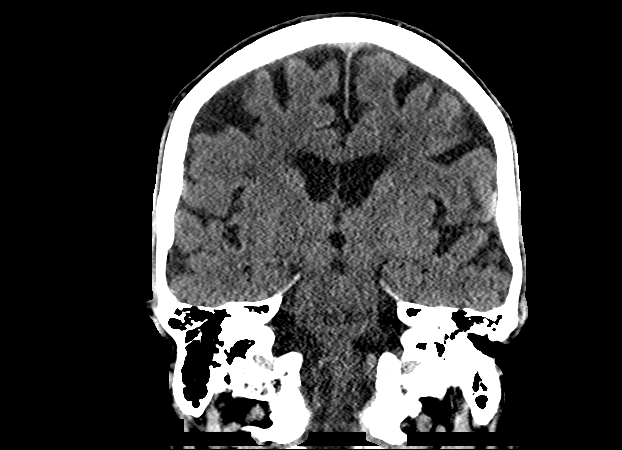

[Series 204: sagittal st, idose (1) · sagittal · 0.40mm/px · 3 of 83 slices shown]
[im 28/83  brain]
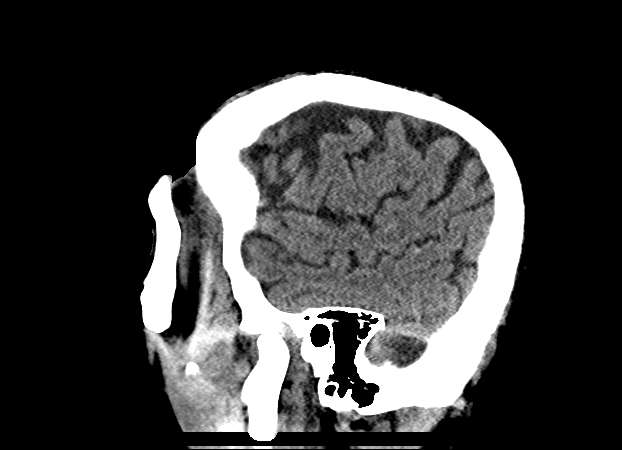
[im 42/83  brain]
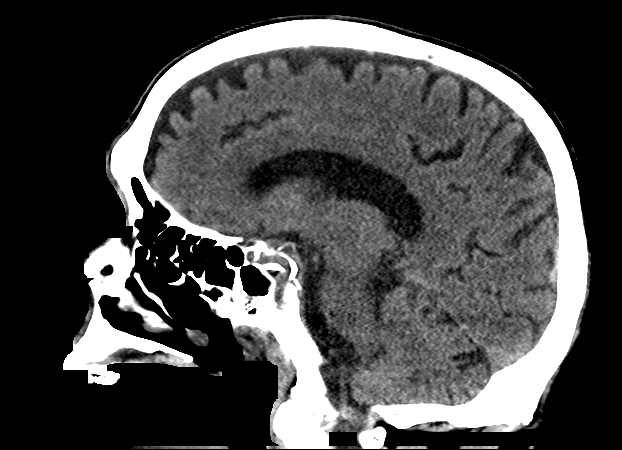
[im 55/83  brain]
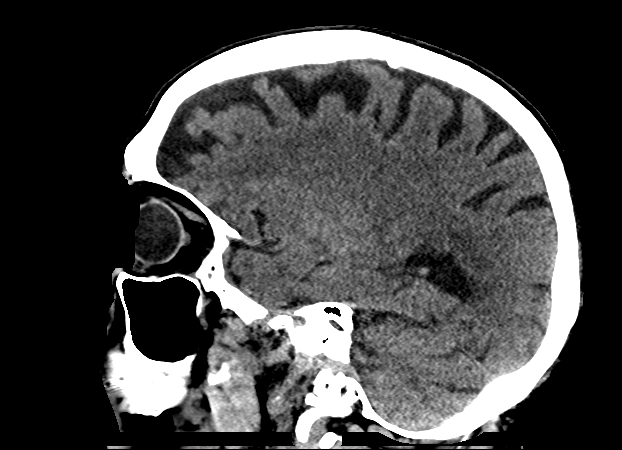

[16 of 47 positions shown; findings below may reference images not displayed]

FINDINGS: Brain: Mild atrophic and chronic white matter ischemic changes are
noted. No findings to suggest acute hemorrhage, acute infarction or
space-occupying mass lesion are noted.

Vascular: No hyperdense vessel or unexpected calcification.

Skull: Normal. Negative for fracture or focal lesion.

Sinuses/Orbits: No acute finding.

Other: None.
IMPRESSION: Chronic atrophic and ischemic changes without acute abnormality.
